# Patient Record
Sex: Female | Born: 1980 | Race: White | Hispanic: No | Smoking: Former smoker
Health system: Southern US, Community
[De-identification: ages and names within clinical notes are randomized; demographics above are authoritative.]

## PROBLEM LIST (undated history)

## (undated) HISTORY — PX: OTHER SURGICAL HISTORY: SHX169

---

## 2019-10-15 HISTORY — PX: LAPAROSCOPY: SHX197

## 2019-11-15 DIAGNOSIS — O00109 Unspecified tubal pregnancy without intrauterine pregnancy: Secondary | ICD-10-CM

## 2019-11-15 HISTORY — DX: Unspecified tubal pregnancy without intrauterine pregnancy: O00.109

## 2020-06-22 ENCOUNTER — Encounter (HOSPITAL_COMMUNITY): Admission: EM | Disposition: A | Discharge status: Rehabilitation Facility | Payer: Self-pay | Source: Home / Self Care

## 2020-06-22 ENCOUNTER — Emergency Department (HOSPITAL_COMMUNITY): Payer: Medicaid Other

## 2020-06-22 ENCOUNTER — Inpatient Hospital Stay (HOSPITAL_COMMUNITY): Payer: Medicaid Other | Admitting: Certified Registered"

## 2020-06-22 ENCOUNTER — Inpatient Hospital Stay (HOSPITAL_COMMUNITY): Payer: Medicaid Other

## 2020-06-22 ENCOUNTER — Inpatient Hospital Stay (HOSPITAL_COMMUNITY)
Admission: EM | Admit: 2020-06-22 | Discharge: 2020-07-07 | DRG: 481 | Disposition: A | Discharge status: Rehabilitation Facility | Payer: Self-pay | Attending: Physician Assistant | Admitting: Physician Assistant

## 2020-06-22 ENCOUNTER — Encounter (HOSPITAL_COMMUNITY): Payer: Self-pay | Admitting: General Surgery

## 2020-06-22 ENCOUNTER — Other Ambulatory Visit: Payer: Self-pay

## 2020-06-22 DIAGNOSIS — E559 Vitamin D deficiency, unspecified: Secondary | ICD-10-CM | POA: Diagnosis present

## 2020-06-22 DIAGNOSIS — S01112A Laceration without foreign body of left eyelid and periocular area, initial encounter: Secondary | ICD-10-CM | POA: Diagnosis present

## 2020-06-22 DIAGNOSIS — S82002A Unspecified fracture of left patella, initial encounter for closed fracture: Secondary | ICD-10-CM

## 2020-06-22 DIAGNOSIS — S60222A Contusion of left hand, initial encounter: Secondary | ICD-10-CM | POA: Diagnosis present

## 2020-06-22 DIAGNOSIS — Z820 Family history of epilepsy and other diseases of the nervous system: Secondary | ICD-10-CM

## 2020-06-22 DIAGNOSIS — Z87891 Personal history of nicotine dependence: Secondary | ICD-10-CM

## 2020-06-22 DIAGNOSIS — J9690 Respiratory failure, unspecified, unspecified whether with hypoxia or hypercapnia: Secondary | ICD-10-CM

## 2020-06-22 DIAGNOSIS — Z23 Encounter for immunization: Secondary | ICD-10-CM

## 2020-06-22 DIAGNOSIS — K5900 Constipation, unspecified: Secondary | ICD-10-CM | POA: Diagnosis present

## 2020-06-22 DIAGNOSIS — S2249XA Multiple fractures of ribs, unspecified side, initial encounter for closed fracture: Secondary | ICD-10-CM

## 2020-06-22 DIAGNOSIS — E8889 Other specified metabolic disorders: Secondary | ICD-10-CM | POA: Diagnosis present

## 2020-06-22 DIAGNOSIS — S298XXA Other specified injuries of thorax, initial encounter: Secondary | ICD-10-CM

## 2020-06-22 DIAGNOSIS — S72321A Displaced transverse fracture of shaft of right femur, initial encounter for closed fracture: Principal | ICD-10-CM | POA: Diagnosis present

## 2020-06-22 DIAGNOSIS — Z20822 Contact with and (suspected) exposure to covid-19: Secondary | ICD-10-CM | POA: Diagnosis present

## 2020-06-22 DIAGNOSIS — G8918 Other acute postprocedural pain: Secondary | ICD-10-CM | POA: Diagnosis present

## 2020-06-22 DIAGNOSIS — S82001A Unspecified fracture of right patella, initial encounter for closed fracture: Secondary | ICD-10-CM

## 2020-06-22 DIAGNOSIS — S82042A Displaced comminuted fracture of left patella, initial encounter for closed fracture: Secondary | ICD-10-CM | POA: Diagnosis present

## 2020-06-22 DIAGNOSIS — S82852A Displaced trimalleolar fracture of left lower leg, initial encounter for closed fracture: Secondary | ICD-10-CM | POA: Diagnosis present

## 2020-06-22 DIAGNOSIS — S20219A Contusion of unspecified front wall of thorax, initial encounter: Secondary | ICD-10-CM

## 2020-06-22 DIAGNOSIS — Z79890 Hormone replacement therapy (postmenopausal): Secondary | ICD-10-CM

## 2020-06-22 DIAGNOSIS — R000 Tachycardia, unspecified: Secondary | ICD-10-CM | POA: Diagnosis present

## 2020-06-22 DIAGNOSIS — S82892A Other fracture of left lower leg, initial encounter for closed fracture: Secondary | ICD-10-CM

## 2020-06-22 DIAGNOSIS — S2242XA Multiple fractures of ribs, left side, initial encounter for closed fracture: Secondary | ICD-10-CM | POA: Diagnosis present

## 2020-06-22 DIAGNOSIS — M79642 Pain in left hand: Secondary | ICD-10-CM | POA: Diagnosis present

## 2020-06-22 DIAGNOSIS — Z419 Encounter for procedure for purposes other than remedying health state, unspecified: Secondary | ICD-10-CM

## 2020-06-22 DIAGNOSIS — S0181XA Laceration without foreign body of other part of head, initial encounter: Secondary | ICD-10-CM | POA: Diagnosis present

## 2020-06-22 DIAGNOSIS — F419 Anxiety disorder, unspecified: Secondary | ICD-10-CM | POA: Diagnosis present

## 2020-06-22 DIAGNOSIS — D62 Acute posthemorrhagic anemia: Secondary | ICD-10-CM | POA: Diagnosis present

## 2020-06-22 DIAGNOSIS — Z833 Family history of diabetes mellitus: Secondary | ICD-10-CM

## 2020-06-22 DIAGNOSIS — Y92410 Unspecified street and highway as the place of occurrence of the external cause: Secondary | ICD-10-CM

## 2020-06-22 DIAGNOSIS — T148XXA Other injury of unspecified body region, initial encounter: Secondary | ICD-10-CM

## 2020-06-22 DIAGNOSIS — S7291XA Unspecified fracture of right femur, initial encounter for closed fracture: Secondary | ICD-10-CM | POA: Diagnosis present

## 2020-06-22 DIAGNOSIS — S82872A Displaced pilon fracture of left tibia, initial encounter for closed fracture: Secondary | ICD-10-CM | POA: Insufficient documentation

## 2020-06-22 HISTORY — PX: EXTERNAL FIXATION LEG: SHX1549

## 2020-06-22 HISTORY — PX: FEMUR IM NAIL: SHX1597

## 2020-06-22 HISTORY — PX: ORIF PATELLA: SHX5033

## 2020-06-22 LAB — URINALYSIS, ROUTINE W REFLEX MICROSCOPIC
Bacteria, UA: NONE SEEN
Bilirubin Urine: NEGATIVE
Glucose, UA: NEGATIVE mg/dL
Ketones, ur: 80 mg/dL — AB
Leukocytes,Ua: NEGATIVE
Nitrite: NEGATIVE
Protein, ur: NEGATIVE mg/dL
Specific Gravity, Urine: 1.045 — ABNORMAL HIGH (ref 1.005–1.030)
pH: 5 (ref 5.0–8.0)

## 2020-06-22 LAB — COMPREHENSIVE METABOLIC PANEL
ALT: 17 U/L (ref 0–44)
AST: 21 U/L (ref 15–41)
Albumin: 3.2 g/dL — ABNORMAL LOW (ref 3.5–5.0)
Alkaline Phosphatase: 27 U/L — ABNORMAL LOW (ref 38–126)
Anion gap: 8 (ref 5–15)
BUN: 17 mg/dL (ref 6–20)
CO2: 19 mmol/L — ABNORMAL LOW (ref 22–32)
Calcium: 7.6 mg/dL — ABNORMAL LOW (ref 8.9–10.3)
Chloride: 112 mmol/L — ABNORMAL HIGH (ref 98–111)
Creatinine, Ser: 0.67 mg/dL (ref 0.44–1.00)
GFR calc Af Amer: >60 mL/min (ref >60)
GFR calc non Af Amer: >60 mL/min (ref >60)
Glucose, Bld: 110 mg/dL — ABNORMAL HIGH (ref 70–99)
Potassium: 3.5 mmol/L (ref 3.5–5.1)
Sodium: 139 mmol/L (ref 135–145)
Total Bilirubin: 0.6 mg/dL (ref 0.3–1.2)
Total Protein: 5.7 g/dL — ABNORMAL LOW (ref 6.5–8.1)

## 2020-06-22 LAB — I-STAT CHEM 8, ED
BUN: 19 mg/dL (ref 6–20)
Calcium, Ion: 1.01 mmol/L — ABNORMAL LOW (ref 1.15–1.40)
Chloride: 110 mmol/L (ref 98–111)
Creatinine, Ser: 0.6 mg/dL (ref 0.44–1.00)
Glucose, Bld: 106 mg/dL — ABNORMAL HIGH (ref 70–99)
HCT: 36 % (ref 36.0–46.0)
Hemoglobin: 12.2 g/dL (ref 12.0–15.0)
Potassium: 3.4 mmol/L — ABNORMAL LOW (ref 3.5–5.1)
Sodium: 140 mmol/L (ref 135–145)
TCO2: 17 mmol/L — ABNORMAL LOW (ref 22–32)

## 2020-06-22 LAB — CBC
HCT: 39.2 % (ref 36.0–46.0)
Hemoglobin: 11.9 g/dL — ABNORMAL LOW (ref 12.0–15.0)
MCH: 28.5 pg (ref 26.0–34.0)
MCHC: 30.4 g/dL (ref 30.0–36.0)
MCV: 94 fL (ref 80.0–100.0)
Platelets: 220 10*3/uL (ref 150–400)
RBC: 4.17 MIL/uL (ref 3.87–5.11)
RDW: 12.9 % (ref 11.5–15.5)
WBC: 15 10*3/uL — ABNORMAL HIGH (ref 4.0–10.5)
nRBC: 0 % (ref 0.0–0.2)

## 2020-06-22 LAB — SAMPLE TO BLOOD BANK

## 2020-06-22 LAB — I-STAT BETA HCG BLOOD, ED (MC, WL, AP ONLY): I-stat hCG, quantitative: <5 m[IU]/mL (ref <5)

## 2020-06-22 LAB — SARS CORONAVIRUS 2 BY RT PCR (HOSPITAL ORDER, PERFORMED IN ~~LOC~~ HOSPITAL LAB): SARS Coronavirus 2: NEGATIVE

## 2020-06-22 LAB — ETHANOL: Alcohol, Ethyl (B): <10 mg/dL (ref <10)

## 2020-06-22 LAB — LACTIC ACID, PLASMA: Lactic Acid, Venous: 1.3 mmol/L (ref 0.5–1.9)

## 2020-06-22 LAB — SURGICAL PCR SCREEN
MRSA, PCR: NEGATIVE
Staphylococcus aureus: NEGATIVE

## 2020-06-22 LAB — PROTIME-INR
INR: 1 (ref 0.8–1.2)
Prothrombin Time: 12.7 seconds (ref 11.4–15.2)

## 2020-06-22 SURGERY — INSERTION, INTRAMEDULLARY ROD, FEMUR, RETROGRADE
Anesthesia: General | Site: Leg Upper | Laterality: Right

## 2020-06-22 MED ORDER — METHOCARBAMOL 500 MG PO TABS
ORAL_TABLET | ORAL | Status: AC
  Filled 2020-06-22: qty 1

## 2020-06-22 MED ORDER — VANCOMYCIN HCL 1000 MG IV SOLR
INTRAVENOUS | Status: DC | PRN
  Administered 2020-06-22: 1000 mg via TOPICAL

## 2020-06-22 MED ORDER — HYDROMORPHONE HCL 1 MG/ML IJ SOLN: 0.5000 mg | INTRAMUSCULAR | Status: DC | PRN

## 2020-06-22 MED ORDER — CEFAZOLIN SODIUM-DEXTROSE 2-4 GM/100ML-% IV SOLN
2.0000 g | Freq: Three times a day (TID) | INTRAVENOUS | Status: AC
  Administered 2020-06-22 – 2020-06-23 (×3): 2 g via INTRAVENOUS
  Filled 2020-06-22 (×3): qty 100

## 2020-06-22 MED ORDER — DOCUSATE SODIUM 100 MG PO CAPS
100.0000 mg | ORAL_CAPSULE | Freq: Two times a day (BID) | ORAL | Status: DC
  Administered 2020-06-22 – 2020-07-07 (×29): 100 mg via ORAL
  Filled 2020-06-22 (×29): qty 1

## 2020-06-22 MED ORDER — ACETAMINOPHEN 10 MG/ML IV SOLN
1000.0000 mg | Freq: Once | INTRAVENOUS | Status: AC
  Administered 2020-06-22: 1000 mg via INTRAVENOUS

## 2020-06-22 MED ORDER — ACETAMINOPHEN 500 MG PO TABS
1000.0000 mg | ORAL_TABLET | Freq: Four times a day (QID) | ORAL | Status: DC
  Administered 2020-06-22 – 2020-07-07 (×54): 1000 mg via ORAL
  Filled 2020-06-22 (×61): qty 2

## 2020-06-22 MED ORDER — FENTANYL CITRATE (PF) 100 MCG/2ML IJ SOLN
50.0000 ug | INTRAMUSCULAR | Status: DC | PRN
  Administered 2020-06-22: 50 ug via INTRAVENOUS
  Filled 2020-06-22: qty 2

## 2020-06-22 MED ORDER — KETAMINE HCL 50 MG/5ML IJ SOSY
1.0000 mg/kg | PREFILLED_SYRINGE | Freq: Once | INTRAMUSCULAR | Status: DC
  Filled 2020-06-22: qty 10

## 2020-06-22 MED ORDER — ROCURONIUM BROMIDE 10 MG/ML (PF) SYRINGE
PREFILLED_SYRINGE | INTRAVENOUS | Status: AC
  Filled 2020-06-22: qty 10

## 2020-06-22 MED ORDER — HYDROMORPHONE HCL 1 MG/ML IJ SOLN
0.2500 mg | INTRAMUSCULAR | Status: DC | PRN
  Administered 2020-06-22 (×7): 0.5 mg via INTRAVENOUS

## 2020-06-22 MED ORDER — PHENYLEPHRINE 40 MCG/ML (10ML) SYRINGE FOR IV PUSH (FOR BLOOD PRESSURE SUPPORT)
PREFILLED_SYRINGE | INTRAVENOUS | Status: DC | PRN
  Administered 2020-06-22: 160 ug via INTRAVENOUS
  Administered 2020-06-22 (×2): 120 ug via INTRAVENOUS

## 2020-06-22 MED ORDER — POVIDONE-IODINE 10 % EX SWAB: 2.0000 "application " | Freq: Once | CUTANEOUS | Status: DC

## 2020-06-22 MED ORDER — OXYCODONE HCL 5 MG PO TABS: 5.0000 mg | ORAL_TABLET | ORAL | Status: DC | PRN

## 2020-06-22 MED ORDER — ENOXAPARIN SODIUM 40 MG/0.4ML ~~LOC~~ SOLN: 40.0000 mg | SUBCUTANEOUS | Status: DC

## 2020-06-22 MED ORDER — IOHEXOL 300 MG/ML  SOLN
100.0000 mL | Freq: Once | INTRAMUSCULAR | Status: AC | PRN
  Administered 2020-06-22: 100 mL via INTRAVENOUS

## 2020-06-22 MED ORDER — METHOCARBAMOL 500 MG PO TABS
500.0000 mg | ORAL_TABLET | Freq: Four times a day (QID) | ORAL | Status: DC | PRN
  Administered 2020-06-22 – 2020-06-23 (×2): 500 mg via ORAL
  Filled 2020-06-22 (×2): qty 1

## 2020-06-22 MED ORDER — DEXAMETHASONE SODIUM PHOSPHATE 10 MG/ML IJ SOLN
INTRAMUSCULAR | Status: DC | PRN
  Administered 2020-06-22: 5 mg via INTRAVENOUS

## 2020-06-22 MED ORDER — FENTANYL CITRATE (PF) 250 MCG/5ML IJ SOLN
INTRAMUSCULAR | Status: AC
  Filled 2020-06-22: qty 5

## 2020-06-22 MED ORDER — CHLORHEXIDINE GLUCONATE 4 % EX LIQD: 60.0000 mL | Freq: Once | CUTANEOUS | Status: DC

## 2020-06-22 MED ORDER — TETANUS-DIPHTH-ACELL PERTUSSIS 5-2.5-18.5 LF-MCG/0.5 IM SUSP
0.5000 mL | Freq: Once | INTRAMUSCULAR | Status: AC
  Administered 2020-06-22: 0.5 mL via INTRAMUSCULAR

## 2020-06-22 MED ORDER — FENTANYL CITRATE (PF) 100 MCG/2ML IJ SOLN
INTRAMUSCULAR | Status: AC
  Filled 2020-06-22: qty 2

## 2020-06-22 MED ORDER — HYDROMORPHONE HCL 1 MG/ML IJ SOLN
INTRAMUSCULAR | Status: AC
  Filled 2020-06-22: qty 1

## 2020-06-22 MED ORDER — DEXAMETHASONE SODIUM PHOSPHATE 10 MG/ML IJ SOLN
INTRAMUSCULAR | Status: AC
  Filled 2020-06-22: qty 1

## 2020-06-22 MED ORDER — CHLORHEXIDINE GLUCONATE 0.12 % MT SOLN
15.0000 mL | Freq: Once | OROMUCOSAL | Status: AC
  Filled 2020-06-22: qty 15

## 2020-06-22 MED ORDER — MIDAZOLAM HCL 2 MG/2ML IJ SOLN
INTRAMUSCULAR | Status: DC | PRN
  Administered 2020-06-22: 2 mg via INTRAVENOUS

## 2020-06-22 MED ORDER — DIPHENHYDRAMINE HCL 25 MG PO CAPS: 25.0000 mg | ORAL_CAPSULE | Freq: Four times a day (QID) | ORAL | Status: DC | PRN

## 2020-06-22 MED ORDER — ACETAMINOPHEN 10 MG/ML IV SOLN
INTRAVENOUS | Status: AC
  Filled 2020-06-22: qty 100

## 2020-06-22 MED ORDER — MIDAZOLAM HCL 2 MG/2ML IJ SOLN
INTRAMUSCULAR | Status: AC
  Filled 2020-06-22: qty 2

## 2020-06-22 MED ORDER — VANCOMYCIN HCL 1000 MG IV SOLR
INTRAVENOUS | Status: AC
  Filled 2020-06-22: qty 1000

## 2020-06-22 MED ORDER — LIDOCAINE-EPINEPHRINE 1 %-1:100000 IJ SOLN
20.0000 mL | Freq: Once | INTRAMUSCULAR | Status: AC
  Administered 2020-06-22: 20 mL
  Filled 2020-06-22: qty 1

## 2020-06-22 MED ORDER — KETAMINE HCL 10 MG/ML IJ SOLN
INTRAMUSCULAR | Status: AC | PRN
  Administered 2020-06-22: 74.8 mg via INTRAVENOUS

## 2020-06-22 MED ORDER — OXYCODONE HCL 5 MG PO TABS
10.0000 mg | ORAL_TABLET | ORAL | Status: DC | PRN
  Administered 2020-06-23: 15 mg via ORAL
  Filled 2020-06-22 (×2): qty 3

## 2020-06-22 MED ORDER — FENTANYL CITRATE (PF) 100 MCG/2ML IJ SOLN
INTRAMUSCULAR | Status: DC | PRN
Start: 2020-06-22 — End: 2020-06-22
  Administered 2020-06-22 (×2): 50 ug via INTRAVENOUS
  Administered 2020-06-22: 100 ug via INTRAVENOUS
  Administered 2020-06-22: 50 ug via INTRAVENOUS
  Administered 2020-06-22: 100 ug via INTRAVENOUS
  Administered 2020-06-22: 50 ug via INTRAVENOUS
  Administered 2020-06-22: 100 ug via INTRAVENOUS

## 2020-06-22 MED ORDER — MORPHINE SULFATE (PF) 2 MG/ML IV SOLN
2.0000 mg | INTRAVENOUS | Status: DC | PRN
  Administered 2020-06-22 – 2020-06-23 (×5): 2 mg via INTRAVENOUS
  Filled 2020-06-22 (×5): qty 1

## 2020-06-22 MED ORDER — CEFAZOLIN SODIUM-DEXTROSE 2-4 GM/100ML-% IV SOLN
2.0000 g | INTRAVENOUS | Status: AC
  Administered 2020-06-22: 2 g via INTRAVENOUS
  Filled 2020-06-22: qty 100

## 2020-06-22 MED ORDER — ONDANSETRON HCL 4 MG/2ML IJ SOLN: 4.0000 mg | Freq: Four times a day (QID) | INTRAMUSCULAR | Status: DC | PRN

## 2020-06-22 MED ORDER — OXYCODONE HCL 5 MG PO TABS
5.0000 mg | ORAL_TABLET | ORAL | Status: DC | PRN
  Administered 2020-06-23: 10 mg via ORAL
  Filled 2020-06-22: qty 2

## 2020-06-22 MED ORDER — GABAPENTIN 100 MG PO CAPS
100.0000 mg | ORAL_CAPSULE | Freq: Three times a day (TID) | ORAL | Status: DC
  Administered 2020-06-22 – 2020-06-23 (×3): 100 mg via ORAL
  Filled 2020-06-22 (×3): qty 1

## 2020-06-22 MED ORDER — KETAMINE HCL 10 MG/ML IJ SOLN
INTRAMUSCULAR | Status: AC | PRN
  Administered 2020-06-22: 25 mg via INTRAVENOUS

## 2020-06-22 MED ORDER — ONDANSETRON HCL 4 MG/2ML IJ SOLN
INTRAMUSCULAR | Status: AC
  Filled 2020-06-22: qty 2

## 2020-06-22 MED ORDER — PHENYLEPHRINE 40 MCG/ML (10ML) SYRINGE FOR IV PUSH (FOR BLOOD PRESSURE SUPPORT)
PREFILLED_SYRINGE | INTRAVENOUS | Status: AC
  Filled 2020-06-22: qty 10

## 2020-06-22 MED ORDER — LIDOCAINE 2% (20 MG/ML) 5 ML SYRINGE
INTRAMUSCULAR | Status: AC
  Filled 2020-06-22: qty 5

## 2020-06-22 MED ORDER — 0.9 % SODIUM CHLORIDE (POUR BTL) OPTIME
TOPICAL | Status: DC | PRN
  Administered 2020-06-22: 1000 mL

## 2020-06-22 MED ORDER — LACTATED RINGERS IV SOLN: INTRAVENOUS | Status: DC

## 2020-06-22 MED ORDER — METOPROLOL TARTRATE 5 MG/5ML IV SOLN: 5.0000 mg | Freq: Four times a day (QID) | INTRAVENOUS | Status: DC | PRN

## 2020-06-22 MED ORDER — SUGAMMADEX SODIUM 200 MG/2ML IV SOLN
INTRAVENOUS | Status: DC | PRN
  Administered 2020-06-22: 140 mg via INTRAVENOUS

## 2020-06-22 MED ORDER — PROPOFOL 10 MG/ML IV BOLUS
INTRAVENOUS | Status: DC | PRN
  Administered 2020-06-22: 30 mg via INTRAVENOUS
  Administered 2020-06-22: 150 mg via INTRAVENOUS

## 2020-06-22 MED ORDER — ONDANSETRON HCL 4 MG/2ML IJ SOLN
INTRAMUSCULAR | Status: DC | PRN
  Administered 2020-06-22: 4 mg via INTRAVENOUS

## 2020-06-22 MED ORDER — SUCCINYLCHOLINE CHLORIDE 20 MG/ML IJ SOLN
INTRAMUSCULAR | Status: DC | PRN
  Administered 2020-06-22: 130 mg via INTRAVENOUS

## 2020-06-22 MED ORDER — SUCCINYLCHOLINE CHLORIDE 200 MG/10ML IV SOSY
PREFILLED_SYRINGE | INTRAVENOUS | Status: AC
  Filled 2020-06-22: qty 10

## 2020-06-22 MED ORDER — ROCURONIUM BROMIDE 10 MG/ML (PF) SYRINGE
PREFILLED_SYRINGE | INTRAVENOUS | Status: DC | PRN
  Administered 2020-06-22: 50 mg via INTRAVENOUS

## 2020-06-22 MED ORDER — SODIUM CHLORIDE 0.9 % IV BOLUS
1000.0000 mL | Freq: Once | INTRAVENOUS | Status: AC
  Administered 2020-06-22: 1000 mL via INTRAVENOUS

## 2020-06-22 MED ORDER — ONDANSETRON 4 MG PO TBDP: 4.0000 mg | ORAL_TABLET | Freq: Four times a day (QID) | ORAL | Status: DC | PRN

## 2020-06-22 MED ORDER — PROPOFOL 10 MG/ML IV BOLUS
INTRAVENOUS | Status: AC
  Filled 2020-06-22: qty 20

## 2020-06-22 MED ORDER — FENTANYL CITRATE (PF) 100 MCG/2ML IJ SOLN
INTRAMUSCULAR | Status: AC | PRN
  Administered 2020-06-22: 100 ug via INTRAVENOUS

## 2020-06-22 MED ORDER — SODIUM CHLORIDE 0.9 % IV SOLN: INTRAVENOUS | Status: DC

## 2020-06-22 MED ORDER — ALBUMIN HUMAN 5 % IV SOLN: INTRAVENOUS | Status: DC | PRN

## 2020-06-22 MED ORDER — LIDOCAINE 2% (20 MG/ML) 5 ML SYRINGE
INTRAMUSCULAR | Status: DC | PRN
  Administered 2020-06-22: 60 mg via INTRAVENOUS

## 2020-06-22 MED ORDER — CHLORHEXIDINE GLUCONATE 0.12 % MT SOLN
OROMUCOSAL | Status: AC
  Administered 2020-06-22: 15 mL via OROMUCOSAL
  Filled 2020-06-22: qty 15

## 2020-06-22 SURGICAL SUPPLY — 101 items
ADH SKN CLS APL DERMABOND .7 (GAUZE/BANDAGES/DRESSINGS) ×3
APL PRP STRL LF DISP 70% ISPRP (MISCELLANEOUS) ×9
BAR EXFX 150X11 NS LF (EXFIX) ×6
BAR EXFX 350X11 NS LF (EXFIX) ×6
BAR GLASS FIBER EXFX 11X150 (EXFIX) ×10 IMPLANT
BAR GLASS FIBER EXFX 11X350 (EXFIX) ×10 IMPLANT
BIT DRILL 2.4 AO COUPLING CANN (BIT) ×5 IMPLANT
BIT DRILL 3.2 XTRAFIX BLUE (BIT) ×5 IMPLANT
BIT DRILL CALIBRATED 4.3MMX365 (DRILL) ×3 IMPLANT
BIT DRILL CANN MED FLUTE 4.0 (BIT) ×3 IMPLANT
BIT DRILL CROWE PNT TWST 4.5MM (DRILL) ×6 IMPLANT
BNDG CMPR MED 10X6 ELC LF (GAUZE/BANDAGES/DRESSINGS) ×3
BNDG COHESIVE 4X5 TAN STRL (GAUZE/BANDAGES/DRESSINGS) IMPLANT
BNDG ELASTIC 4X5.8 VLCR STR LF (GAUZE/BANDAGES/DRESSINGS) ×5 IMPLANT
BNDG ELASTIC 6X10 VLCR STRL LF (GAUZE/BANDAGES/DRESSINGS) ×5 IMPLANT
BNDG ELASTIC 6X5.8 VLCR STR LF (GAUZE/BANDAGES/DRESSINGS) IMPLANT
BNDG GAUZE ELAST 4 BULKY (GAUZE/BANDAGES/DRESSINGS) ×5 IMPLANT
BRUSH SCRUB EZ PLAIN DRY (MISCELLANEOUS) ×10 IMPLANT
CHLORAPREP W/TINT 26 (MISCELLANEOUS) ×15 IMPLANT
CLAMP BLUE BAR TO BAR (EXFIX) ×10 IMPLANT
CLAMP BLUE BAR TO PIN (EXFIX) ×20 IMPLANT
CLOSURE WOUND 1/2 X4 (GAUZE/BANDAGES/DRESSINGS)
COVER MAYO STAND STRL (DRAPES) IMPLANT
COVER SURGICAL LIGHT HANDLE (MISCELLANEOUS) ×10 IMPLANT
COVER WAND RF STERILE (DRAPES) IMPLANT
DERMABOND ADVANCED (GAUZE/BANDAGES/DRESSINGS) ×2
DERMABOND ADVANCED .7 DNX12 (GAUZE/BANDAGES/DRESSINGS) ×3 IMPLANT
DRAPE C-ARM 42X72 X-RAY (DRAPES) ×5 IMPLANT
DRAPE C-ARMOR (DRAPES) ×5 IMPLANT
DRAPE HALF SHEET 40X57 (DRAPES) ×10 IMPLANT
DRAPE IMP U-DRAPE 54X76 (DRAPES) ×10 IMPLANT
DRAPE INCISE IOBAN 66X45 STRL (DRAPES) ×5 IMPLANT
DRAPE ORTHO SPLIT 77X108 STRL (DRAPES) ×20
DRAPE SURG 17X23 STRL (DRAPES) ×5 IMPLANT
DRAPE SURG ORHT 6 SPLT 77X108 (DRAPES) ×12 IMPLANT
DRAPE U-SHAPE 47X51 STRL (DRAPES) ×20 IMPLANT
DRILL CALIBRATED 4.3MMX365 (DRILL) ×5
DRILL CANN 4.0MM (BIT) ×5
DRILL CROWE POINT TWIST 4.5MM (DRILL) ×10
DRSG MEPILEX BORDER 4X4 (GAUZE/BANDAGES/DRESSINGS) ×5 IMPLANT
DRSG MEPILEX BORDER 4X8 (GAUZE/BANDAGES/DRESSINGS) ×5 IMPLANT
DRSG MEPITEL 4X7.2 (GAUZE/BANDAGES/DRESSINGS) IMPLANT
ELECT REM PT RETURN 9FT ADLT (ELECTROSURGICAL) ×5
ELECTRODE REM PT RTRN 9FT ADLT (ELECTROSURGICAL) ×3 IMPLANT
GAUZE SPONGE 4X4 12PLY STRL (GAUZE/BANDAGES/DRESSINGS) IMPLANT
GLOVE BIO SURGEON STRL SZ 6.5 (GLOVE) ×12 IMPLANT
GLOVE BIO SURGEON STRL SZ7.5 (GLOVE) ×20 IMPLANT
GLOVE BIO SURGEONS STRL SZ 6.5 (GLOVE) ×3
GLOVE BIOGEL PI IND STRL 6.5 (GLOVE) ×3 IMPLANT
GLOVE BIOGEL PI IND STRL 7.5 (GLOVE) ×3 IMPLANT
GLOVE BIOGEL PI INDICATOR 6.5 (GLOVE) ×2
GLOVE BIOGEL PI INDICATOR 7.5 (GLOVE) ×2
GOWN STRL REUS W/ TWL LRG LVL3 (GOWN DISPOSABLE) ×12 IMPLANT
GOWN STRL REUS W/TWL LRG LVL3 (GOWN DISPOSABLE) ×20
GUIDEPIN 3.2X17.5 THRD DISP (PIN) ×5 IMPLANT
GUIDEWIRE BEAD TIP (WIRE) ×5 IMPLANT
K-WIRE TROC 1.25X150 (WIRE) ×5
KIT BASIN OR (CUSTOM PROCEDURE TRAY) ×5 IMPLANT
KIT TURNOVER KIT B (KITS) ×5 IMPLANT
KWIRE TROC 1.25X150 (WIRE) ×3 IMPLANT
MANIFOLD NEPTUNE II (INSTRUMENTS) IMPLANT
NAIL FEM RETRO 9X340 (Nail) ×5 IMPLANT
NEEDLE 22X1 1/2 (OR ONLY) (NEEDLE) IMPLANT
NS IRRIG 1000ML POUR BTL (IV SOLUTION) ×5 IMPLANT
PACK ORTHO EXTREMITY (CUSTOM PROCEDURE TRAY) ×5 IMPLANT
PAD ARMBOARD 7.5X6 YLW CONV (MISCELLANEOUS) ×10 IMPLANT
PAD CAST 4YDX4 CTTN HI CHSV (CAST SUPPLIES) ×6 IMPLANT
PADDING CAST COTTON 4X4 STRL (CAST SUPPLIES) ×10
PADDING CAST COTTON 6X4 STRL (CAST SUPPLIES) IMPLANT
PIN 4X100X20MM (EXFIX) ×10 IMPLANT
PIN CLAMP 2BAR 75MM BLUE (EXFIX) ×5 IMPLANT
PIN HALF YELLOW 5X160X35 (EXFIX) ×10 IMPLANT
RETRIEVER SUT HEWSON (MISCELLANEOUS) ×5 IMPLANT
SCREW CANN 4.0X38MM (Screw) ×5 IMPLANT
SCREW CANN PT 4.0X28 (Screw) ×5 IMPLANT
SCREW CANNULATED NS 4MMX36MM (Screw) ×10 IMPLANT
SCREW CANNULATED PT 4.0X42 (Screw) ×5 IMPLANT
SCREW CORT TI DBL LEAD 5X32 (Screw) ×5 IMPLANT
SCREW CORT TI DBL LEAD 5X34 (Screw) ×5 IMPLANT
SCREW CORT TI DBL LEAD 5X48 (Screw) ×5 IMPLANT
SCREW CORT TI DBL LEAD 5X65 (Screw) ×5 IMPLANT
SCREW CORT TI DBL LEAD 5X70 (Screw) ×5 IMPLANT
SPONGE LAP 18X18 RF (DISPOSABLE) ×5 IMPLANT
STAPLER VISISTAT 35W (STAPLE) IMPLANT
STRIP CLOSURE SKIN 1/2X4 (GAUZE/BANDAGES/DRESSINGS) IMPLANT
SUT FIBERWIRE #5 38 CONV NDL (SUTURE) ×10
SUT MNCRL AB 3-0 PS2 18 (SUTURE) ×15 IMPLANT
SUT PROLENE 0 CT (SUTURE) IMPLANT
SUT VIC AB 0 CT1 27 (SUTURE) ×5
SUT VIC AB 0 CT1 27XBRD ANBCTR (SUTURE) ×3 IMPLANT
SUT VIC AB 1 CT1 27 (SUTURE) ×5
SUT VIC AB 1 CT1 27XBRD ANBCTR (SUTURE) ×3 IMPLANT
SUT VIC AB 2-0 CT1 27 (SUTURE) ×10
SUT VIC AB 2-0 CT1 TAPERPNT 27 (SUTURE) ×6 IMPLANT
SUTURE FIBERWR #5 38 CONV NDL (SUTURE) ×6 IMPLANT
TOWEL GREEN STERILE (TOWEL DISPOSABLE) ×10 IMPLANT
TOWEL GREEN STERILE FF (TOWEL DISPOSABLE) ×10 IMPLANT
TUBE CONNECTING 12'X1/4 (SUCTIONS) ×1
TUBE CONNECTING 12X1/4 (SUCTIONS) ×4 IMPLANT
UNDERPAD 30X36 HEAVY ABSORB (UNDERPADS AND DIAPERS) ×10 IMPLANT
YANKAUER SUCT BULB TIP NO VENT (SUCTIONS) ×5 IMPLANT

## 2020-06-22 NOTE — Transfer of Care (Signed)
Immediate Anesthesia Transfer of Care Note  Patient: Megan Bailey  Procedure(s) Performed: RIGHT INTRAMEDULLARY (IM) RETROGRADE FEMORAL NAILING (Right Leg Upper) EXTERNAL FIXATION LEFT ANKLE (Left Leg Lower) OPEN REDUCTION INTERNAL (ORIF) FIXATION BILATERAL PATELLAS (Bilateral Knee)  Patient Location: PACU  Anesthesia Type:General  Level of Consciousness: awake, alert  and oriented  Airway & Oxygen Therapy: Patient Spontanous Breathing  Post-op Assessment: Report given to RN  Post vital signs: Reviewed and stable  Last Vitals:  Vitals Value Taken Time  BP    Temp    Pulse 126 06/22/20 1944  Resp 21 06/22/20 1944  SpO2 100 % 06/22/20 1944  Vitals shown include unvalidated device data.  Last Pain:  Vitals:   06/22/20 1340  TempSrc: Oral  PainSc:          Complications: No complications documented.

## 2020-06-22 NOTE — Anesthesia Postprocedure Evaluation (Signed)
Anesthesia Post Note  Patient: Megan Bailey  Procedure(s) Performed: RIGHT INTRAMEDULLARY (IM) RETROGRADE FEMORAL NAILING (Right Leg Upper) EXTERNAL FIXATION LEFT ANKLE (Left Leg Lower) OPEN REDUCTION INTERNAL (ORIF) FIXATION BILATERAL PATELLAS (Bilateral Knee)     Patient location during evaluation: PACU Anesthesia Type: General Level of consciousness: awake and alert Pain control: pain improving. Vital Signs Assessment: post-procedure vital signs reviewed and stable Respiratory status: spontaneous breathing, nonlabored ventilation, respiratory function stable and patient connected to nasal cannula oxygen Cardiovascular status: blood pressure returned to baseline, stable and tachycardic Postop Assessment: no apparent nausea or vomiting Anesthetic complications: no   No complications documented.  Last Vitals:  Vitals:   06/22/20 2015 06/22/20 2030  BP: 118/67 115/78  Pulse: (!) 116 (!) 115  Resp: (!) 31 (!) 23  Temp:    SpO2: 100% 99%    Last Pain:  Vitals:   06/22/20 2030  TempSrc:   PainSc: 7                  Cecile Hearing
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
## 2020-06-22 NOTE — ED Provider Notes (Signed)
..  Laceration Repair  Date/Time: 06/22/2020 11:26 AM Performed by: Renne Crigler, PA-C Authorized by: Renne Crigler, PA-C   Consent:    Consent obtained:  Emergent situation Anesthesia (see MAR for exact dosages):    Anesthesia method:  Local infiltration   Local anesthetic:  Lidocaine 2% WITH epi Laceration details:    Location:  Face   Face location:  L eyebrow   Length (cm):  4 Repair type:    Repair type:  Simple Pre-procedure details:    Preparation:  Patient was prepped and draped in usual sterile fashion and imaging obtained to evaluate for foreign bodies Exploration:    Hemostasis achieved with:  LET and epinephrine   Wound exploration: wound explored through full range of motion and entire depth of wound probed and visualized     Wound extent: no muscle damage noted     Contaminated: no   Treatment:    Area cleansed with:  Saline   Amount of cleaning:  Standard   Visualized foreign bodies/material removed: no   Skin repair:    Repair method:  Sutures   Suture size:  5-0   Suture material:  Nylon   Suture technique:  Simple interrupted   Number of sutures:  7 Approximation:    Approximation:  Close Post-procedure details:    Dressing:  Open (no dressing)   Patient tolerance of procedure:  Tolerated well, no immediate complications      Renne Crigler, PA-C 06/22/20 1127    Terrilee Files, MD 06/22/20 [id number]
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
## 2020-06-22 NOTE — Progress Notes (Signed)
Pt arrived from ED via hospital bed  Accompanied by staff/BF Alert//oriented in no apparent distress.orientated/sitiated to room/equipments. Welcome guide/menu provided with instructions. Pt verbalized understanding of instructions. Pt has been informed that facility is not responsible for any losses/damages to any personal belongings/valuables and its best to be kept at home and hand it over to security for accounting puroposes. No compalints voiced.

## 2020-06-22 NOTE — Anesthesia Procedure Notes (Signed)
Procedure Name: Intubation Date/Time: 06/22/2020 4:54 PM Performed by: Barrington Ellison, CRNA Pre-anesthesia Checklist: Patient identified, Emergency Drugs available, Suction available and Patient being monitored Patient Re-evaluated:Patient Re-evaluated prior to induction Oxygen Delivery Method: Circle System Utilized Preoxygenation: Pre-oxygenation with 100% oxygen Induction Type: IV induction Ventilation: Mask ventilation without difficulty Laryngoscope Size: Mac and 3 Grade View: Grade I Tube type: Oral Tube size: 7.0 mm Number of attempts: 1 Airway Equipment and Method: Stylet and Oral airway Placement Confirmation: ETT inserted through vocal cords under direct vision,  positive ETCO2 and breath sounds checked- equal and bilateral Secured at: 21 cm Tube secured with: Tape Dental Injury: Teeth and Oropharynx as per pre-operative assessment

## 2020-06-22 NOTE — Progress Notes (Signed)
Orthopedic Tech Progress Note Patient Details:  Megan Bailey [birth year] [id number]  Ortho Devices Type of Ortho Device: Knee Immobilizer Splint Material: Plaster Ortho Device/Splint Location: Left Knee Ortho Device/Splint Interventions: Application, Adjustment   Post Interventions Patient Tolerated: Well Instructions Provided: Adjustment of device   [name] 06/22/2020, 10:02 PM
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
## 2020-06-22 NOTE — Anesthesia Preprocedure Evaluation (Signed)
Anesthesia Evaluation  Patient identified by MRN, date of birth, ID band Patient awake  General Assessment Comment:History noted CG  Reviewed: Allergy & Precautions, NPO status , Patient's Chart, lab work & pertinent test results  Airway Mallampati: II  TM Distance: >3 FB     Dental   Pulmonary former smoker,    breath sounds clear to auscultation       Cardiovascular negative cardio ROS   Rhythm:Regular Rate:Normal     Neuro/Psych    GI/Hepatic negative GI ROS, Neg liver ROS,   Endo/Other  negative endocrine ROS  Renal/GU negative Renal ROS     Musculoskeletal   Abdominal   Peds  Hematology   Anesthesia Other Findings   Reproductive/Obstetrics                             Anesthesia Physical Anesthesia Plan  ASA: I and emergent  Anesthesia Plan: General   Post-op Pain Management:    Induction: Intravenous  PONV Risk Score and Plan: 3 and Ondansetron, Dexamethasone and Midazolam  Airway Management Planned: Oral ETT  Additional Equipment:   Intra-op Plan:   Post-operative Plan: Possible Post-op intubation/ventilation  Informed Consent: I have reviewed the patients History and Physical, chart, labs and discussed the procedure including the risks, benefits and alternatives for the proposed anesthesia with the patient or authorized representative who has indicated his/her understanding and acceptance.       Plan Discussed with: CRNA and Anesthesiologist  Anesthesia Plan Comments:         Anesthesia Quick Evaluation

## 2020-06-22 NOTE — OR Nursing (Signed)
Care of patient assumed at 1911.

## 2020-06-22 NOTE — Progress Notes (Signed)
   06/22/20 0824  Clinical Encounter Type  Visited With Patient  Visit Type Trauma  Referral From Nurse  Consult/Referral To Chaplain  Spiritual Encounters  Spiritual Needs Emotional  Stress Factors  Patient Stress Factors Major life changes;None identified  Family Stress Factors None identified  Chaplain responded to Level 2 Trama
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
## 2020-06-22 NOTE — Progress Notes (Signed)
Orthopedic Tech Progress Note Patient Details:  Megan Bailey [birth year] [id number]  Ortho Devices Type of Ortho Device: Post (short) splint, Stirrup splint Splint Material: Plaster Ortho Device/Splint Interventions: Ordered, Application, Adjustment      Post Interventions Patient Tolerated: Well Instructions Provided: Adjustment of device   [name] 06/22/2020, 11:39 AM

## 2020-06-22 NOTE — Progress Notes (Signed)
Orthopedic Tech Progress Note Patient Details:  Megan Bailey Jun 28, 1981 [id number]  Musculoskeletal Traction Type of Traction: Bucks Skin Traction Traction Location: RLE Traction Weight: 15 lbs   Post Interventions Patient Tolerated: Well Instructions Provided: Adjustment of device   Daine Gunther 06/22/2020, 11:40 AM

## 2020-06-22 NOTE — Procedures (Signed)
Procedure: Closed reduction right femur and left ankle  Indication: Right femur and left ankle fractures  Surgeon: Charma Igo, PA-C  Assist: None  Anesthesia: Ketamine as CS via EDP  EBL: None  Complications: None  Findings: After risks/benefits explained patient desires to undergo procedure. Consent obtained and time out performed. Sedation was given and verified. Left ankle was reduced and splinted. Right femur was reduced and Bucks traction applied. Pt tolerated the procedure well.    Freeman Caldron, PA-C Orthopedic Surgery [phone]
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
## 2020-06-23 ENCOUNTER — Encounter (HOSPITAL_COMMUNITY): Payer: Self-pay | Admitting: Student

## 2020-06-23 ENCOUNTER — Inpatient Hospital Stay (HOSPITAL_COMMUNITY): Payer: Medicaid Other

## 2020-06-23 LAB — CBC
HCT: 28.2 % — ABNORMAL LOW (ref 36.0–46.0)
HCT: 27.1 % — ABNORMAL LOW (ref 36.0–46.0)
HCT: 24.2 % — ABNORMAL LOW (ref 36.0–46.0)
Hemoglobin: 9.3 g/dL — ABNORMAL LOW (ref 12.0–15.0)
Hemoglobin: 8.5 g/dL — ABNORMAL LOW (ref 12.0–15.0)
Hemoglobin: 7.5 g/dL — ABNORMAL LOW (ref 12.0–15.0)
MCH: 29.8 pg (ref 26.0–34.0)
MCH: 28.5 pg (ref 26.0–34.0)
MCH: 28.2 pg (ref 26.0–34.0)
MCHC: 33 g/dL (ref 30.0–36.0)
MCHC: 31.4 g/dL (ref 30.0–36.0)
MCHC: 31 g/dL (ref 30.0–36.0)
MCV: 90.4 fL (ref 80.0–100.0)
MCV: 90.9 fL (ref 80.0–100.0)
MCV: 91 fL (ref 80.0–100.0)
Platelets: 240 10*3/uL (ref 150–400)
Platelets: 226 10*3/uL (ref 150–400)
Platelets: 188 10*3/uL (ref 150–400)
RBC: 3.12 MIL/uL — ABNORMAL LOW (ref 3.87–5.11)
RBC: 2.98 MIL/uL — ABNORMAL LOW (ref 3.87–5.11)
RBC: 2.66 MIL/uL — ABNORMAL LOW (ref 3.87–5.11)
RDW: 13.2 % (ref 11.5–15.5)
RDW: 13.2 % (ref 11.5–15.5)
RDW: 13.2 % (ref 11.5–15.5)
WBC: 7 10*3/uL (ref 4.0–10.5)
WBC: 8.7 10*3/uL (ref 4.0–10.5)
WBC: 7.6 10*3/uL (ref 4.0–10.5)
nRBC: 0 % (ref 0.0–0.2)
nRBC: 0 % (ref 0.0–0.2)
nRBC: 0 % (ref 0.0–0.2)

## 2020-06-23 LAB — VITAMIN D 25 HYDROXY (VIT D DEFICIENCY, FRACTURES): Vit D, 25-Hydroxy: 18.56 ng/mL — ABNORMAL LOW (ref 30–100)

## 2020-06-23 LAB — HIV ANTIBODY (ROUTINE TESTING W REFLEX): HIV Screen 4th Generation wRfx: NONREACTIVE

## 2020-06-23 LAB — BASIC METABOLIC PANEL
Anion gap: 7 (ref 5–15)
BUN: <5 mg/dL — ABNORMAL LOW (ref 6–20)
CO2: 23 mmol/L (ref 22–32)
Calcium: 7.4 mg/dL — ABNORMAL LOW (ref 8.9–10.3)
Chloride: 107 mmol/L (ref 98–111)
Creatinine, Ser: 0.53 mg/dL (ref 0.44–1.00)
GFR calc Af Amer: >60 mL/min (ref >60)
GFR calc non Af Amer: >60 mL/min (ref >60)
Glucose, Bld: 160 mg/dL — ABNORMAL HIGH (ref 70–99)
Potassium: 3.2 mmol/L — ABNORMAL LOW (ref 3.5–5.1)
Sodium: 137 mmol/L (ref 135–145)

## 2020-06-23 MED ORDER — TRAMADOL HCL 50 MG PO TABS
50.0000 mg | ORAL_TABLET | Freq: Four times a day (QID) | ORAL | Status: DC | PRN
Start: 2020-06-23 — End: 2020-06-23

## 2020-06-23 MED ORDER — VITAMIN D 25 MCG (1000 UNIT) PO TABS
2000.0000 [IU] | ORAL_TABLET | Freq: Two times a day (BID) | ORAL | Status: DC
  Administered 2020-06-23 – 2020-07-07 (×27): 2000 [IU] via ORAL
  Filled 2020-06-23 (×29): qty 2

## 2020-06-23 MED ORDER — CHLORHEXIDINE GLUCONATE CLOTH 2 % EX PADS
6.0000 | MEDICATED_PAD | Freq: Every day | CUTANEOUS | Status: DC
  Administered 2020-06-23 – 2020-07-07 (×10): 6 via TOPICAL

## 2020-06-23 MED ORDER — POLYETHYLENE GLYCOL 3350 17 G PO PACK
17.0000 g | PACK | Freq: Every day | ORAL | Status: DC
  Administered 2020-06-23 – 2020-07-02 (×7): 17 g via ORAL
  Filled 2020-06-23 (×14): qty 1

## 2020-06-23 MED ORDER — LACTATED RINGERS IV BOLUS
500.0000 mL | Freq: Once | INTRAVENOUS | Status: AC
  Administered 2020-06-23: 500 mL via INTRAVENOUS

## 2020-06-23 MED ORDER — METHOCARBAMOL 500 MG PO TABS
1000.0000 mg | ORAL_TABLET | Freq: Three times a day (TID) | ORAL | Status: DC
  Administered 2020-06-23 – 2020-07-07 (×43): 1000 mg via ORAL
  Filled 2020-06-23 (×42): qty 2

## 2020-06-23 MED ORDER — HYDROMORPHONE HCL 1 MG/ML IJ SOLN
0.5000 mg | INTRAMUSCULAR | Status: DC | PRN
  Administered 2020-06-23 – 2020-06-25 (×4): 1 mg via INTRAVENOUS
  Filled 2020-06-23 (×4): qty 1

## 2020-06-23 MED ORDER — GABAPENTIN 100 MG PO CAPS
200.0000 mg | ORAL_CAPSULE | Freq: Three times a day (TID) | ORAL | Status: DC
  Administered 2020-06-23 – 2020-06-24 (×2): 200 mg via ORAL
  Filled 2020-06-23 (×2): qty 2

## 2020-06-23 MED ORDER — LORAZEPAM 2 MG/ML IJ SOLN
0.5000 mg | Freq: Once | INTRAMUSCULAR | Status: AC
  Administered 2020-06-23: 0.5 mg via INTRAVENOUS
  Filled 2020-06-23: qty 1

## 2020-06-23 MED ORDER — OXYCODONE HCL 5 MG PO TABS
10.0000 mg | ORAL_TABLET | ORAL | Status: DC | PRN
  Administered 2020-06-23 – 2020-06-28 (×8): 10 mg via ORAL
  Administered 2020-06-29 – 2020-07-02 (×12): 15 mg via ORAL
  Administered 2020-07-03: 10 mg via ORAL
  Administered 2020-07-03: 15 mg via ORAL
  Administered 2020-07-04: 10 mg via ORAL
  Administered 2020-07-04 – 2020-07-07 (×6): 15 mg via ORAL
  Filled 2020-06-23 (×2): qty 2
  Filled 2020-06-23 (×4): qty 3
  Filled 2020-06-23: qty 2
  Filled 2020-06-23: qty 3
  Filled 2020-06-23 (×2): qty 2
  Filled 2020-06-23: qty 3
  Filled 2020-06-23: qty 2
  Filled 2020-06-23 (×4): qty 3
  Filled 2020-06-23 (×2): qty 2
  Filled 2020-06-23 (×6): qty 3
  Filled 2020-06-23: qty 2
  Filled 2020-06-23 (×2): qty 3
  Filled 2020-06-23 (×2): qty 2
  Filled 2020-06-23 (×2): qty 3

## 2020-06-23 NOTE — Progress Notes (Signed)
   Vital Signs @MEWSNOTE @       06/23/2020,5:03 PM  The PA is aware of the increased heart rate.

## 2020-06-23 NOTE — Progress Notes (Signed)
Rehab Admissions Coordinator Note:  Patient was screened by Clois Dupes for appropriateness for an Inpatient Acute Rehab Consult per therapy recs.  At this time, we are recommending await definitive timing of ortho surgical plan before proceeding with consult.   Clois Dupes RN MSN 06/23/2020, 1:55 PM  I can be reached at [phone].

## 2020-06-23 NOTE — Progress Notes (Signed)
Heart rate of 138 documented via tele monitor- Patient denies any distress at this time but is having increased pain.

## 2020-06-23 NOTE — Progress Notes (Signed)
The patient was educated regarding her pain medication regimen.  The patient was under the impression that nursing staff would be bringing her pain medication every 2 hours but was educated in regards to PRN medication and asking for it when needed.  The patient appears anxious and has numerous complaints- toilet tissue being too rough, kleenex being too small and crying because her boyfriend has been arguing with her on the phone.

## 2020-06-23 NOTE — Progress Notes (Signed)
Leary Roca, PA here to assess .  Portable chest xray ordered

## 2020-06-23 NOTE — Progress Notes (Signed)
Heart rate 130's  Continuous cardiac monitor in place.  Patient has difficulty with anxiety and no improvement with pain.  PRN given as ordered.
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
## 2020-06-23 NOTE — Progress Notes (Signed)
Portable chest xray done.

## 2020-06-23 NOTE — Plan of Care (Signed)
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
## 2020-06-23 NOTE — Progress Notes (Signed)
Megan Maczis, PA notified of the patient's heart rate remaining in the 130's- 140's.  The patient does appear to be very anxious and having difficulty with pain management.  PRN pain medications given as ordered.  Continuous cardiac monitor in place with a rate of 133 sinus Tachycardia New orders will be received for anxiety.

## 2020-06-23 NOTE — Progress Notes (Signed)
Dr Sheliah Hatch sent a secure epic message with update regarding heart rate 120-130's- with no changes in status .

## 2020-06-23 NOTE — Progress Notes (Signed)
   06/23/20 1157  Vitals  Temp 98.3 F (36.8 C)  Temp Source Oral  BP 110/63  BP Location Right Arm  BP Method Automatic  Patient Position (if appropriate) Lying  ECG Heart Rate (!) 112  Resp 16  Level of Consciousness  Level of Consciousness Alert  MEWS COLOR  MEWS Score Color Yellow  Oxygen Therapy  SpO2 100 %  O2 Device Room Air  Pain Assessment  Pain Scale 0-10  Pain Score 7  Pain Type Surgical pain  Pain Location Leg  Pain Orientation Right;Left  Pain Intervention(s) Repositioned  PCA/Epidural/Spinal Assessment  Respiratory Pattern Regular;Unlabored  ECG Monitoring  Telemetry Box Number 5 North 06  MEWS Score  MEWS Temp 0  MEWS Systolic 0  MEWS Pulse 2  MEWS RR 0  MEWS LOC 0  MEWS Score 2     06/23/20 1157  Vitals  Temp 98.3 F (36.8 C)  Temp Source Oral  BP 110/63  BP Location Right Arm  BP Method Automatic  Patient Position (if appropriate) Lying  ECG Heart Rate (!) 112  Resp 16  Level of Consciousness  Level of Consciousness Alert  MEWS COLOR  MEWS Score Color Yellow  Oxygen Therapy  SpO2 100 %  O2 Device Room Air  Pain Assessment  Pain Scale 0-10  Pain Score 7  Pain Type Surgical pain  Pain Location Leg  Pain Orientation Right;Left  Pain Intervention(s) Repositioned  PCA/Epidural/Spinal Assessment  Respiratory Pattern Regular;Unlabored  ECG Monitoring  Telemetry Box Number 5 North 06  MEWS Score  MEWS Temp 0  MEWS Systolic 0  MEWS Pulse 2  MEWS RR 0  MEWS LOC 0  MEWS Score 2  No distress noted .

## 2020-06-23 NOTE — Progress Notes (Signed)
EKG completed

## 2020-06-23 NOTE — TOC CAGE-AID Note (Signed)
Transition of Care [facility]) - CAGE-AID Screening   Patient Details  Name: Megan Bailey MRN: [id number] Date of Birth: 1981-03-03  [facility] CM/SW Contact:    Emeterio Reeve, [place] Phone Number: 06/23/2020, 2:21 PM   Clinical Narrative: CSW met with pt at bedside. CSW introduced self and explained her role at the hospital.  PT denies alcohol use and substance use. Pt did not need any resources at this time.    CAGE-AID Screening:    Have You Ever Felt You Ought to Cut Down on Your Drinking or Drug Use?: No Have People Annoyed You By Critizing Your Drinking Or Drug Use?: No Have You Felt Bad Or Guilty About Your Drinking Or Drug Use?: No Have You Ever Had a Drink or Used Drugs First Thing In The Morning to Steady Your Nerves or to Get Rid of a Hangover?: No CAGE-AID Score: 0  Substance Abuse Education Offered: Yes    Blima Ledger, [place] Social Worker [phone]

## 2020-06-23 NOTE — Progress Notes (Signed)
Patient has continued with increased heart rate

## 2020-06-24 LAB — CBC
HCT: 26.2 % — ABNORMAL LOW (ref 36.0–46.0)
Hemoglobin: 8.3 g/dL — ABNORMAL LOW (ref 12.0–15.0)
MCH: 29.3 pg (ref 26.0–34.0)
MCHC: 31.7 g/dL (ref 30.0–36.0)
MCV: 92.6 fL (ref 80.0–100.0)
Platelets: 209 10*3/uL (ref 150–400)
RBC: 2.83 MIL/uL — ABNORMAL LOW (ref 3.87–5.11)
RDW: 13.2 % (ref 11.5–15.5)
WBC: 7.3 10*3/uL (ref 4.0–10.5)
nRBC: 0 % (ref 0.0–0.2)

## 2020-06-24 LAB — HEMOGLOBIN AND HEMATOCRIT, BLOOD
HCT: 27.7 % — ABNORMAL LOW (ref 36.0–46.0)
Hemoglobin: 8.9 g/dL — ABNORMAL LOW (ref 12.0–15.0)

## 2020-06-24 LAB — PREPARE RBC (CROSSMATCH)

## 2020-06-24 LAB — ABO/RH: ABO/RH(D): A POS

## 2020-06-24 MED ORDER — KETOROLAC TROMETHAMINE 15 MG/ML IJ SOLN
15.0000 mg | Freq: Three times a day (TID) | INTRAMUSCULAR | Status: AC
  Administered 2020-06-24 – 2020-06-27 (×9): 15 mg via INTRAVENOUS
  Filled 2020-06-24 (×9): qty 1

## 2020-06-24 MED ORDER — GABAPENTIN 300 MG PO CAPS
300.0000 mg | ORAL_CAPSULE | Freq: Three times a day (TID) | ORAL | Status: DC
  Administered 2020-06-24 – 2020-06-30 (×17): 300 mg via ORAL
  Filled 2020-06-24 (×17): qty 1

## 2020-06-24 MED ORDER — SODIUM CHLORIDE 0.9 % IV BOLUS: 1000.0000 mL | Freq: Once | INTRAVENOUS | Status: DC

## 2020-06-24 MED ORDER — VITAMIN D (ERGOCALCIFEROL) 1.25 MG (50000 UNIT) PO CAPS
50000.0000 [IU] | ORAL_CAPSULE | ORAL | Status: DC
  Administered 2020-06-24 – 2020-07-01 (×2): 50000 [IU] via ORAL
  Filled 2020-06-24 (×2): qty 1

## 2020-06-24 MED ORDER — SODIUM CHLORIDE 0.9% IV SOLUTION: Freq: Once | INTRAVENOUS | Status: AC

## 2020-06-24 MED ORDER — SODIUM CHLORIDE 0.9% IV SOLUTION: Freq: Once | INTRAVENOUS | Status: DC

## 2020-06-24 MED ORDER — ENOXAPARIN SODIUM 30 MG/0.3ML ~~LOC~~ SOLN
30.0000 mg | Freq: Two times a day (BID) | SUBCUTANEOUS | Status: DC
  Administered 2020-06-24 – 2020-07-07 (×26): 30 mg via SUBCUTANEOUS
  Filled 2020-06-24 (×26): qty 0.3
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
## 2020-06-24 NOTE — Plan of Care (Signed)
  Problem: Education: Goal: Knowledge of General Education information will improve Description: Including pain rating scale, medication(s)/side effects and non-pharmacologic comfort measures Outcome: Progressing   Problem: Education: Goal: Knowledge of General Education information will improve Description: Including pain rating scale, medication(s)/side effects and non-pharmacologic comfort measures Outcome: Progressing   Problem: Coping: Goal: Level of anxiety will decrease Outcome: Progressing   Problem: Pain Managment: Goal: General experience of comfort will improve Outcome: Progressing   Problem: Safety: Goal: Ability to remain free from injury will improve Outcome: Progressing   Problem: Skin Integrity: Goal: Risk for impaired skin integrity will decrease Outcome: Progressing   

## 2020-06-24 NOTE — Progress Notes (Addendum)
   06/24/20 1817  Assess: MEWS Score  Temp 100.1 F (37.8 C)  BP (!) 95/59 (RN notified)  Pulse Rate (!) 143 (RN nortified)  Resp 18  Level of Consciousness Alert  SpO2 94 %  O2 Device Room Air  Assess: MEWS Score  MEWS Temp 0  MEWS Systolic 1  MEWS Pulse 3  MEWS RR 0  MEWS LOC 0  MEWS Score 4  MEWS Score Color Red  Assess: if the MEWS score is Yellow or Red  Were vital signs taken at a resting state? Yes  Focused Assessment Change from prior assessment (see assessment flowsheet)  Early Detection of Sepsis Score *See Row Information* Low  MEWS guidelines implemented *See Row Information* Yes  Take Vital Signs  Increase Vital Sign Frequency  Red: Q 1hr X 4 then Q 4hr X 4, if remains red, continue Q 4hrs  Escalate  MEWS: Escalate Red: discuss with charge nurse/RN and provider, consider discussing with RRT  Notify: Charge Nurse/RN  Name of Charge Nurse/RN Notified Roj, RN  Date Charge Nurse/RN Notified 06/24/20  Time Charge Nurse/RN Notified 1817  Notify: Provider  Provider Name/Title Dr. Manus Rudd  Date Provider Notified 06/24/20  Time Provider Notified 1820  Notification Type Page  Notification Reason Change in status  Response See new orders  Date of Provider Response 06/24/20  Time of Provider Response 1821  Notify: Rapid Response  Name of Rapid Response RN Notified Luisa Hart, RN  Date Rapid Response Notified 06/24/20  Time Rapid Response Notified 1830    MD and Rapid RN notified. Received new orders from Dr. Corliss Skains. 1000cc NS bolus started at 18:35, discontinued NS bolus and started 1unit PRBC transfusion per MD. Vital signs taken and recorded. No adverse reactions noted. Pt alert, denies chest pain. Pt now resting comfortably in bed, endorsed accordingly to oncoming RN.

## 2020-06-24 NOTE — Plan of Care (Signed)
  Problem: Education: Goal: Knowledge of General Education information will improve Description: Including pain rating scale, medication(s)/side effects and non-pharmacologic comfort measures Outcome: Progressing   Problem: Health Behavior/Discharge Planning: Goal: Ability to manage health-related needs will improve Outcome: Progressing   Problem: Activity: Goal: Risk for activity intolerance will decrease Outcome: Progressing   Problem: Coping: Goal: Level of anxiety will decrease Outcome: Progressing   Problem: Elimination: Goal: Will not experience complications related to bowel motility Outcome: Progressing   Problem: Pain Managment: Goal: General experience of comfort will improve Outcome: Progressing   Problem: Safety: Goal: Ability to remain free from injury will improve Outcome: Progressing   Problem: Skin Integrity: Goal: Risk for impaired skin integrity will decrease Outcome: Progressing   

## 2020-06-24 NOTE — Progress Notes (Signed)
Pt had been refusing mobility and repositioning, educated on risks/complications of immobility. Pt encouraged to use bedpan but Pt refused and wants to keep the purewick.

## 2020-06-24 NOTE — Progress Notes (Signed)
One unit administered, called by on-call to hold off on the 2nd unit, H&H 2 hours post then contact with result. Passed to on coming RN.
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
## 2020-06-24 NOTE — Plan of Care (Signed)

## 2020-06-25 LAB — CBC
HCT: 28.6 % — ABNORMAL LOW (ref 36.0–46.0)
Hemoglobin: 9.2 g/dL — ABNORMAL LOW (ref 12.0–15.0)
MCH: 28.5 pg (ref 26.0–34.0)
MCHC: 32.2 g/dL (ref 30.0–36.0)
MCV: 88.5 fL (ref 80.0–100.0)
Platelets: 190 10*3/uL (ref 150–400)
RBC: 3.23 MIL/uL — ABNORMAL LOW (ref 3.87–5.11)
RDW: 14.8 % (ref 11.5–15.5)
WBC: 7.4 10*3/uL (ref 4.0–10.5)
nRBC: 0 % (ref 0.0–0.2)

## 2020-06-25 LAB — BPAM RBC
Blood Product Expiration Date: 202110022359
Blood Product Expiration Date: 202110022359
ISSUE DATE / TIME: 202109110347
ISSUE DATE / TIME: 202109111833
Unit Type and Rh: 6200
Unit Type and Rh: 6200

## 2020-06-25 LAB — TYPE AND SCREEN
ABO/RH(D): A POS
Antibody Screen: NEGATIVE
Unit division: 0
Unit division: 0

## 2020-06-25 LAB — BASIC METABOLIC PANEL
Anion gap: 5 (ref 5–15)
BUN: <5 mg/dL — ABNORMAL LOW (ref 6–20)
CO2: 26 mmol/L (ref 22–32)
Calcium: 7.3 mg/dL — ABNORMAL LOW (ref 8.9–10.3)
Chloride: 109 mmol/L (ref 98–111)
Creatinine, Ser: 0.57 mg/dL (ref 0.44–1.00)
GFR calc Af Amer: >60 mL/min (ref >60)
GFR calc non Af Amer: >60 mL/min (ref >60)
Glucose, Bld: 103 mg/dL — ABNORMAL HIGH (ref 70–99)
Potassium: 3.3 mmol/L — ABNORMAL LOW (ref 3.5–5.1)
Sodium: 140 mmol/L (ref 135–145)
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
## 2020-06-25 NOTE — Plan of Care (Addendum)
Encouraged Pt to use bedpan but Pt refused and wants to keep the purewick for today. Educated also on the importance of mobility. Pt states understanding.  Problem: Education: Goal: Knowledge of General Education information will improve Description: Including pain rating scale, medication(s)/side effects and non-pharmacologic comfort measures Outcome: Progressing   Problem: Health Behavior/Discharge Planning: Goal: Ability to manage health-related needs will improve Outcome: Progressing   Problem: Clinical Measurements: Goal: Ability to maintain clinical measurements within normal limits will improve Outcome: Progressing   Problem: Activity: Goal: Risk for activity intolerance will decrease Outcome: Progressing   Problem: Nutrition: Goal: Adequate nutrition will be maintained Outcome: Progressing   Problem: Coping: Goal: Level of anxiety will decrease Outcome: Progressing   Problem: Elimination: Goal: Will not experience complications related to bowel motility Outcome: Progressing   Problem: Pain Managment: Goal: General experience of comfort will improve Outcome: Progressing   Problem: Safety: Goal: Ability to remain free from injury will improve Outcome: Progressing   Problem: Skin Integrity: Goal: Risk for impaired skin integrity will decrease Outcome: Progressing
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
## 2020-06-25 NOTE — Progress Notes (Signed)
Sutures to left upper, below eyebrow removed. Site unremarkable. Patient tolerated procedure well.

## 2020-06-26 ENCOUNTER — Encounter (HOSPITAL_COMMUNITY): Payer: Self-pay | Admitting: Student

## 2020-06-26 LAB — CBC
HCT: 30.3 % — ABNORMAL LOW (ref 36.0–46.0)
Hemoglobin: 9.5 g/dL — ABNORMAL LOW (ref 12.0–15.0)
MCH: 28.2 pg (ref 26.0–34.0)
MCHC: 31.4 g/dL (ref 30.0–36.0)
MCV: 89.9 fL (ref 80.0–100.0)
Platelets: 244 10*3/uL (ref 150–400)
RBC: 3.37 MIL/uL — ABNORMAL LOW (ref 3.87–5.11)
RDW: 14.6 % (ref 11.5–15.5)
WBC: 6.4 10*3/uL (ref 4.0–10.5)
nRBC: 0 % (ref 0.0–0.2)

## 2020-06-26 MED ORDER — SODIUM CHLORIDE 0.9 % IV SOLN: INTRAVENOUS | Status: DC

## 2020-06-26 MED ORDER — HYDROMORPHONE HCL 1 MG/ML IJ SOLN
0.5000 mg | INTRAMUSCULAR | Status: DC | PRN
  Administered 2020-06-26 – 2020-06-29 (×3): 0.5 mg via INTRAVENOUS
  Filled 2020-06-26 (×3): qty 0.5

## 2020-06-26 MED ORDER — TRAMADOL HCL 50 MG PO TABS
50.0000 mg | ORAL_TABLET | Freq: Four times a day (QID) | ORAL | Status: DC
  Administered 2020-06-26 – 2020-06-29 (×11): 50 mg via ORAL
  Filled 2020-06-26 (×11): qty 1
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
## 2020-06-26 NOTE — Plan of Care (Signed)
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
## 2020-06-26 NOTE — Progress Notes (Signed)
Patient refuses to take the pure wick off. Stated 'it's too painful to move'. Of note, patient is on her monthly period. Patient also found to have few blisters and rash on left thigh where the knee immobilizer is on. Foam dressings are placed for cushion. Also patient c/o neck pain - warm packs and repositioning were offered with no relief. Left leg has kept elevated with ice packs. Educated patient to turn as much as she can tolerate to prevent any pressure injury, pt verbalized understanding. Will continue to monitor patient.
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
## 2020-06-26 NOTE — Plan of Care (Signed)

## 2020-06-27 MED ORDER — CEFAZOLIN SODIUM-DEXTROSE 2-4 GM/100ML-% IV SOLN
2.0000 g | INTRAVENOUS | Status: AC
  Administered 2020-06-28: 2 g via INTRAVENOUS
  Filled 2020-06-27 (×2): qty 100

## 2020-06-27 NOTE — Plan of Care (Addendum)
Pin care done per order. Encouraged to use bedpan instead of purewick but Pt continued to refuse. Encouraged to reposition and turn while in bed, Pt states understanding.   Problem: Education: Goal: Knowledge of General Education information will improve Description: Including pain rating scale, medication(s)/side effects and non-pharmacologic comfort measures Outcome: Progressing   Problem: Health Behavior/Discharge Planning: Goal: Ability to manage health-related needs will improve Outcome: Progressing   Problem: Clinical Measurements: Goal: Ability to maintain clinical measurements within normal limits will improve Outcome: Progressing   Problem: Activity: Goal: Risk for activity intolerance will decrease Outcome: Progressing   Problem: Coping: Goal: Level of anxiety will decrease Outcome: Progressing   Problem: Elimination: Goal: Will not experience complications related to bowel motility Outcome: Progressing   Problem: Pain Managment: Goal: General experience of comfort will improve Outcome: Progressing   Problem: Safety: Goal: Ability to remain free from injury will improve Outcome: Progressing   Problem: Skin Integrity: Goal: Risk for impaired skin integrity will decrease Outcome: Progressing
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
## 2020-06-28 ENCOUNTER — Encounter (HOSPITAL_COMMUNITY): Admission: EM | Disposition: A | Discharge status: Rehabilitation Facility | Payer: Self-pay | Source: Home / Self Care

## 2020-06-28 ENCOUNTER — Inpatient Hospital Stay (HOSPITAL_COMMUNITY): Payer: Medicaid Other | Admitting: Anesthesiology

## 2020-06-28 ENCOUNTER — Inpatient Hospital Stay (HOSPITAL_COMMUNITY): Payer: Medicaid Other

## 2020-06-28 ENCOUNTER — Encounter (HOSPITAL_COMMUNITY): Payer: Self-pay

## 2020-06-28 HISTORY — PX: OPEN REDUCTION INTERNAL FIXATION (ORIF) TIBIA/FIBULA FRACTURE: SHX5992

## 2020-06-28 SURGERY — OPEN REDUCTION INTERNAL FIXATION (ORIF) TIBIA/FIBULA FRACTURE
Anesthesia: Regional | Laterality: Left

## 2020-06-28 MED ORDER — DEXAMETHASONE SODIUM PHOSPHATE 10 MG/ML IJ SOLN
INTRAMUSCULAR | Status: DC | PRN
  Administered 2020-06-28: 10 mg via INTRAVENOUS

## 2020-06-28 MED ORDER — SUGAMMADEX SODIUM 200 MG/2ML IV SOLN
INTRAVENOUS | Status: DC | PRN
  Administered 2020-06-28: 200 mg via INTRAVENOUS

## 2020-06-28 MED ORDER — ACETAMINOPHEN 160 MG/5ML PO SOLN: 1000.0000 mg | Freq: Once | ORAL | Status: DC | PRN

## 2020-06-28 MED ORDER — PROPOFOL 10 MG/ML IV BOLUS
INTRAVENOUS | Status: AC
  Filled 2020-06-28: qty 20

## 2020-06-28 MED ORDER — OXYCODONE HCL 5 MG/5ML PO SOLN: 5.0000 mg | Freq: Once | ORAL | Status: DC | PRN

## 2020-06-28 MED ORDER — FENTANYL CITRATE (PF) 100 MCG/2ML IJ SOLN
INTRAMUSCULAR | Status: DC | PRN
Start: 2020-06-28 — End: 2020-06-28
  Administered 2020-06-28: 100 ug via INTRAVENOUS
  Administered 2020-06-28 (×2): 50 ug via INTRAVENOUS
  Administered 2020-06-28: 100 ug via INTRAVENOUS
  Administered 2020-06-28 (×2): 50 ug via INTRAVENOUS

## 2020-06-28 MED ORDER — VANCOMYCIN HCL 1000 MG IV SOLR
INTRAVENOUS | Status: DC | PRN
  Administered 2020-06-28: 1000 mg

## 2020-06-28 MED ORDER — ONDANSETRON HCL 4 MG/2ML IJ SOLN
INTRAMUSCULAR | Status: DC | PRN
  Administered 2020-06-28: 4 mg via INTRAVENOUS

## 2020-06-28 MED ORDER — LACTATED RINGERS IV SOLN: INTRAVENOUS | Status: DC | PRN

## 2020-06-28 MED ORDER — 0.9 % SODIUM CHLORIDE (POUR BTL) OPTIME
TOPICAL | Status: DC | PRN
  Administered 2020-06-28: 1000 mL

## 2020-06-28 MED ORDER — DEXAMETHASONE SODIUM PHOSPHATE 10 MG/ML IJ SOLN
INTRAMUSCULAR | Status: AC
  Filled 2020-06-28: qty 1

## 2020-06-28 MED ORDER — PROPOFOL 10 MG/ML IV BOLUS
INTRAVENOUS | Status: DC | PRN
  Administered 2020-06-28: 160 mg via INTRAVENOUS

## 2020-06-28 MED ORDER — VANCOMYCIN HCL 1000 MG IV SOLR
INTRAVENOUS | Status: AC
  Filled 2020-06-28: qty 1000

## 2020-06-28 MED ORDER — WHITE PETROLATUM EX OINT
TOPICAL_OINTMENT | CUTANEOUS | Status: AC
  Filled 2020-06-28: qty 28.35

## 2020-06-28 MED ORDER — ROCURONIUM BROMIDE 10 MG/ML (PF) SYRINGE
PREFILLED_SYRINGE | INTRAVENOUS | Status: DC | PRN
  Administered 2020-06-28: 20 mg via INTRAVENOUS
  Administered 2020-06-28: 10 mg via INTRAVENOUS
  Administered 2020-06-28: 70 mg via INTRAVENOUS

## 2020-06-28 MED ORDER — BUPIVACAINE HCL (PF) 0.5 % IJ SOLN
INTRAMUSCULAR | Status: AC
  Filled 2020-06-28: qty 30

## 2020-06-28 MED ORDER — MIDAZOLAM HCL 5 MG/5ML IJ SOLN
INTRAMUSCULAR | Status: DC | PRN
  Administered 2020-06-28: 2 mg via INTRAVENOUS

## 2020-06-28 MED ORDER — FENTANYL CITRATE (PF) 100 MCG/2ML IJ SOLN
INTRAMUSCULAR | Status: AC
  Filled 2020-06-28: qty 2

## 2020-06-28 MED ORDER — FENTANYL CITRATE (PF) 100 MCG/2ML IJ SOLN
25.0000 ug | INTRAMUSCULAR | Status: DC | PRN
  Administered 2020-06-28 (×2): 50 ug via INTRAVENOUS

## 2020-06-28 MED ORDER — ROCURONIUM BROMIDE 10 MG/ML (PF) SYRINGE
PREFILLED_SYRINGE | INTRAVENOUS | Status: AC
  Filled 2020-06-28: qty 10

## 2020-06-28 MED ORDER — ALBUMIN HUMAN 5 % IV SOLN: INTRAVENOUS | Status: DC | PRN

## 2020-06-28 MED ORDER — DOUBLE ANTIBIOTIC 500-10000 UNIT/GM EX OINT
TOPICAL_OINTMENT | CUTANEOUS | Status: AC
  Filled 2020-06-28: qty 28.4

## 2020-06-28 MED ORDER — ONDANSETRON HCL 4 MG/2ML IJ SOLN
INTRAMUSCULAR | Status: AC
  Filled 2020-06-28: qty 2

## 2020-06-28 MED ORDER — ACETAMINOPHEN 10 MG/ML IV SOLN: 1000.0000 mg | Freq: Once | INTRAVENOUS | Status: DC | PRN

## 2020-06-28 MED ORDER — CEFAZOLIN SODIUM-DEXTROSE 2-4 GM/100ML-% IV SOLN
2.0000 g | Freq: Three times a day (TID) | INTRAVENOUS | Status: AC
  Administered 2020-06-28 – 2020-06-29 (×3): 2 g via INTRAVENOUS
  Filled 2020-06-28 (×4): qty 100

## 2020-06-28 MED ORDER — FENTANYL CITRATE (PF) 250 MCG/5ML IJ SOLN
INTRAMUSCULAR | Status: AC
  Filled 2020-06-28: qty 5

## 2020-06-28 MED ORDER — LIDOCAINE 2% (20 MG/ML) 5 ML SYRINGE
INTRAMUSCULAR | Status: AC
  Filled 2020-06-28: qty 5

## 2020-06-28 MED ORDER — LIDOCAINE 2% (20 MG/ML) 5 ML SYRINGE
INTRAMUSCULAR | Status: DC | PRN
  Administered 2020-06-28: 70 mg via INTRAVENOUS

## 2020-06-28 MED ORDER — OXYCODONE HCL 5 MG PO TABS: 5.0000 mg | ORAL_TABLET | Freq: Once | ORAL | Status: DC | PRN

## 2020-06-28 MED ORDER — ACETAMINOPHEN 500 MG PO TABS: 1000.0000 mg | ORAL_TABLET | Freq: Once | ORAL | Status: DC | PRN

## 2020-06-28 MED ORDER — MIDAZOLAM HCL 2 MG/2ML IJ SOLN
INTRAMUSCULAR | Status: AC
  Filled 2020-06-28: qty 2

## 2020-06-28 SURGICAL SUPPLY — 84 items
APL PRP STRL LF DISP 70% ISPRP (MISCELLANEOUS) ×1
BANDAGE ESMARK 6X9 LF (GAUZE/BANDAGES/DRESSINGS) ×1 IMPLANT
BIT DRILL 2.5X2.75 QC CALB (BIT) ×2 IMPLANT
BIT DRILL CALIBRATED 2.7 (BIT) ×1 IMPLANT
BIT DRILL CALIBRATED 2.7MM (BIT) ×1
BNDG CMPR 9X6 STRL LF SNTH (GAUZE/BANDAGES/DRESSINGS) ×1
BNDG CMPR MED 15X6 ELC VLCR LF (GAUZE/BANDAGES/DRESSINGS) ×1
BNDG COHESIVE 4X5 TAN STRL (GAUZE/BANDAGES/DRESSINGS) ×3 IMPLANT
BNDG ELASTIC 4X5.8 VLCR STR LF (GAUZE/BANDAGES/DRESSINGS) ×2 IMPLANT
BNDG ELASTIC 6X15 VLCR STRL LF (GAUZE/BANDAGES/DRESSINGS) ×2 IMPLANT
BNDG ESMARK 6X9 LF (GAUZE/BANDAGES/DRESSINGS) ×3
BONE CANC CHIPS 20CC PCAN1/4 (Bone Implant) ×3 IMPLANT
BRUSH SCRUB EZ PLAIN DRY (MISCELLANEOUS) ×6 IMPLANT
CHIPS CANC BONE 20CC PCAN1/4 (Bone Implant) ×1 IMPLANT
CHLORAPREP W/TINT 26 (MISCELLANEOUS) ×3 IMPLANT
CNTNR URN SCR LID CUP LEK RST (MISCELLANEOUS) IMPLANT
CONT SPEC 4OZ STRL OR WHT (MISCELLANEOUS) ×3
COVER SURGICAL LIGHT HANDLE (MISCELLANEOUS) ×3 IMPLANT
COVER WAND RF STERILE (DRAPES) ×3 IMPLANT
DRAPE C-ARM 42X72 X-RAY (DRAPES) ×3 IMPLANT
DRAPE C-ARMOR (DRAPES) ×3 IMPLANT
DRAPE ORTHO SPLIT 77X108 STRL (DRAPES) ×6
DRAPE SURG ORHT 6 SPLT 77X108 (DRAPES) ×2 IMPLANT
DRAPE U-SHAPE 47X51 STRL (DRAPES) ×3 IMPLANT
DRSG MEPITEL 4X7.2 (GAUZE/BANDAGES/DRESSINGS) ×2 IMPLANT
ELECT REM PT RETURN 9FT ADLT (ELECTROSURGICAL) ×3
ELECTRODE REM PT RTRN 9FT ADLT (ELECTROSURGICAL) ×1 IMPLANT
GAUZE SPONGE 4X4 12PLY STRL (GAUZE/BANDAGES/DRESSINGS) ×2 IMPLANT
GLOVE BIO SURGEON STRL SZ 6.5 (GLOVE) ×6 IMPLANT
GLOVE BIO SURGEON STRL SZ7.5 (GLOVE) ×9 IMPLANT
GLOVE BIO SURGEONS STRL SZ 6.5 (GLOVE) ×3
GLOVE BIOGEL PI IND STRL 6.5 (GLOVE) ×1 IMPLANT
GLOVE BIOGEL PI IND STRL 7.5 (GLOVE) ×1 IMPLANT
GLOVE BIOGEL PI INDICATOR 6.5 (GLOVE) ×2
GLOVE BIOGEL PI INDICATOR 7.5 (GLOVE) ×2
GOWN STRL REUS W/ TWL LRG LVL3 (GOWN DISPOSABLE) ×2 IMPLANT
GOWN STRL REUS W/TWL LRG LVL3 (GOWN DISPOSABLE) ×6
GRAFT BNE CANC CHIPS 1-8 20CC (Bone Implant) IMPLANT
K-WIRE ACE 1.6X6 (WIRE) ×15
KIT TURNOVER KIT B (KITS) ×3 IMPLANT
KWIRE ACE 1.6X6 (WIRE) IMPLANT
MANIFOLD NEPTUNE II (INSTRUMENTS) ×3 IMPLANT
NDL HYPO 21X1.5 SAFETY (NEEDLE) IMPLANT
NDL HYPO 25GX1X1/2 BEV (NEEDLE) ×1 IMPLANT
NEEDLE HYPO 21X1.5 SAFETY (NEEDLE) ×3 IMPLANT
NEEDLE HYPO 25GX1X1/2 BEV (NEEDLE) ×3 IMPLANT
NS IRRIG 1000ML POUR BTL (IV SOLUTION) ×3 IMPLANT
PACK TOTAL JOINT (CUSTOM PROCEDURE TRAY) ×3 IMPLANT
PAD ARMBOARD 7.5X6 YLW CONV (MISCELLANEOUS) ×6 IMPLANT
PAD CAST 4YDX4 CTTN HI CHSV (CAST SUPPLIES) IMPLANT
PADDING CAST COTTON 4X4 STRL (CAST SUPPLIES) ×3
PADDING CAST COTTON 6X4 STRL (CAST SUPPLIES) ×2 IMPLANT
PLATE 9H LT DIST ANTLAT TIB (Plate) ×3 IMPLANT
PLATE ANTLAT CNTR 156X9 (Plate) IMPLANT
PLATE LOCK 6H 77 BILAT FIB (Plate) ×2 IMPLANT
SCREW CORTICAL 3.5MM  28MM (Screw) ×4 IMPLANT
SCREW CORTICAL 3.5MM 26MM (Screw) ×6 IMPLANT
SCREW CORTICAL 3.5MM 28MM (Screw) IMPLANT
SCREW LOCK CORT STAR 3.5X10 (Screw) ×2 IMPLANT
SCREW LOCK CORT STAR 3.5X12 (Screw) ×2 IMPLANT
SCREW LOCK CORT STAR 3.5X18 (Screw) ×2 IMPLANT
SCREW LOCK CORT STAR 3.5X34 (Screw) ×2 IMPLANT
SCREW LOCK CORT STAR 3.5X40 (Screw) ×2 IMPLANT
SCREW LOCK CORT STAR 3.5X42 (Screw) ×2 IMPLANT
SCREW LOCK CORT STAR 3.5X46 (Screw) ×4 IMPLANT
SCREW LOW PROFILE 3.5X30MM TIS (Screw) ×2 IMPLANT
SCREW LP NL T15 3.5X26 (Screw) ×2 IMPLANT
SCREW T15 LP CORT 3.5X46MM NS (Screw) ×2 IMPLANT
SPONGE LAP 18X18 RF (DISPOSABLE) ×2 IMPLANT
STAPLER VISISTAT 35W (STAPLE) ×3 IMPLANT
SUCTION FRAZIER HANDLE 10FR (MISCELLANEOUS) ×2
SUCTION TUBE FRAZIER 10FR DISP (MISCELLANEOUS) ×1 IMPLANT
SUT ETHILON 2 0 FS 18 (SUTURE) ×2 IMPLANT
SUT ETHILON 3 0 PS 1 (SUTURE) ×6 IMPLANT
SUT VIC AB 0 CT1 27 (SUTURE) ×6
SUT VIC AB 0 CT1 27XBRD ANBCTR (SUTURE) ×1 IMPLANT
SUT VIC AB 2-0 CT1 27 (SUTURE) ×6
SUT VIC AB 2-0 CT1 TAPERPNT 27 (SUTURE) ×2 IMPLANT
SYR 10ML LL (SYRINGE) ×2 IMPLANT
SYR CONTROL 10ML LL (SYRINGE) ×3 IMPLANT
TOWEL GREEN STERILE (TOWEL DISPOSABLE) ×6 IMPLANT
TOWEL GREEN STERILE FF (TOWEL DISPOSABLE) ×3 IMPLANT
UNDERPAD 30X36 HEAVY ABSORB (UNDERPADS AND DIAPERS) ×3 IMPLANT
WATER STERILE IRR 1000ML POUR (IV SOLUTION) ×3 IMPLANT
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
## 2020-06-28 NOTE — Transfer of Care (Signed)
Immediate Anesthesia Transfer of Care Note  Patient: Megan Bailey  Procedure(s) Performed: OPEN REDUCTION INTERNAL FIXATION (ORIF) PILON FRACTURE (Left )  Patient Location: PACU  Anesthesia Type:General  Level of Consciousness: awake, oriented and patient cooperative  Airway & Oxygen Therapy: Patient Spontanous Breathing and Patient connected to nasal cannula oxygen  Post-op Assessment: Report given to RN and Post -op Vital signs reviewed and stable  Post vital signs: Reviewed  Last Vitals:  Vitals Value Taken Time  BP 133/73 06/28/20 1127  Temp    Pulse 132 06/28/20 1128  Resp 20 06/28/20 1128  SpO2 100 % 06/28/20 1128  Vitals shown include unvalidated device data.  Last Pain:  Vitals:   06/28/20 0343  TempSrc: Oral  PainSc:       Patients Stated Pain Goal: 0 (06/26/20 2138)  Complications: No complications documented.

## 2020-06-28 NOTE — Plan of Care (Signed)

## 2020-06-28 NOTE — Progress Notes (Signed)
Orthopedic Tech Progress Note Patient Details:  Megan Bailey 02/25/1981 [id number] Called in order to [employer] for a HINGED KNEE BRACE Patient ID: Megan Bailey, female   DOB: 04/16/1981, 39 y.o.   MRN: [id number]   Donald Pore 06/28/2020, 12:51 PM

## 2020-06-28 NOTE — Anesthesia Procedure Notes (Signed)
Procedure Name: Intubation Date/Time: 06/28/2020 8:55 AM Performed by: Lovie Chol, CRNA Pre-anesthesia Checklist: Patient identified, Emergency Drugs available, Suction available and Patient being monitored Patient Re-evaluated:Patient Re-evaluated prior to induction Oxygen Delivery Method: Circle System Utilized Preoxygenation: Pre-oxygenation with 100% oxygen Induction Type: IV induction Ventilation: Mask ventilation without difficulty Laryngoscope Size: Miller and 2 Grade View: Grade I Tube type: Oral Tube size: 7.0 mm Number of attempts: 1 Airway Equipment and Method: Stylet and Oral airway Placement Confirmation: ETT inserted through vocal cords under direct vision,  positive ETCO2 and breath sounds checked- equal and bilateral Secured at: 21 cm Tube secured with: Tape Dental Injury: Teeth and Oropharynx as per pre-operative assessment
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
## 2020-06-28 NOTE — Anesthesia Preprocedure Evaluation (Addendum)
Anesthesia Evaluation  Patient identified by MRN, date of birth, ID band Patient awake    Reviewed: Allergy & Precautions, NPO status , Patient's Chart, lab work & pertinent test results  History of Anesthesia Complications Negative for: history of anesthetic complications  Airway Mallampati: II  TM Distance: >3 FB Neck ROM: Full    Dental  (+) Dental Advisory Given   Pulmonary neg recent URI, former smoker,  Covid-19 Nucleic Acid Test Results Lab Results      Component                Value               Date                      SARSCOV2NAA              NEGATIVE            06/22/2020              breath sounds clear to auscultation       Cardiovascular negative cardio ROS   Rhythm:Regular     Neuro/Psych negative neurological ROS  negative psych ROS   GI/Hepatic negative GI ROS, Neg liver ROS,   Endo/Other  negative endocrine ROS  Renal/GU negative Renal ROS     Musculoskeletal negative musculoskeletal ROS (+)   Abdominal   Peds  Hematology negative hematology ROS (+)   Anesthesia Other Findings   Reproductive/Obstetrics                             Anesthesia Physical Anesthesia Plan  ASA: I  Anesthesia Plan: General   Post-op Pain Management:    Induction: Intravenous  PONV Risk Score and Plan: Ondansetron and Dexamethasone  Airway Management Planned: LMA and Oral ETT  Additional Equipment: None  Intra-op Plan:   Post-operative Plan: Extubation in OR  Informed Consent: I have reviewed the patients History and Physical, chart, labs and discussed the procedure including the risks, benefits and alternatives for the proposed anesthesia with the patient or authorized representative who has indicated his/her understanding and acceptance.     Dental advisory given  Plan Discussed with: CRNA and Surgeon  Anesthesia Plan Comments:         Anesthesia Quick  Evaluation

## 2020-06-28 NOTE — Progress Notes (Signed)
1230 Received pt from PACU, A&O x4. LLE with ace wrap dry and intact, with knee immobilizer on. LLE is still numb due to nerve block.  Skin assessment done. Pt has opened blisters to right buttock and the back of bil thighs. Foam dressings applied. Wound care order placed.

## 2020-06-28 NOTE — Interval H&P Note (Signed)
Risks and benefits discussed with patient regarding ORIF of left pilon. She agrees to proceed and consent obtained.  Truitt Merle MD

## 2020-06-29 ENCOUNTER — Encounter (HOSPITAL_COMMUNITY): Payer: Self-pay | Admitting: Student

## 2020-06-29 LAB — CBC
HCT: 27.4 % — ABNORMAL LOW (ref 36.0–46.0)
Hemoglobin: 8.6 g/dL — ABNORMAL LOW (ref 12.0–15.0)
MCH: 28.2 pg (ref 26.0–34.0)
MCHC: 31.4 g/dL (ref 30.0–36.0)
MCV: 89.8 fL (ref 80.0–100.0)
Platelets: 342 10*3/uL (ref 150–400)
RBC: 3.05 MIL/uL — ABNORMAL LOW (ref 3.87–5.11)
RDW: 14.5 % (ref 11.5–15.5)
WBC: 9.3 10*3/uL (ref 4.0–10.5)
nRBC: 0 % (ref 0.0–0.2)

## 2020-06-29 LAB — BASIC METABOLIC PANEL
Anion gap: 8 (ref 5–15)
BUN: 8 mg/dL (ref 6–20)
CO2: 26 mmol/L (ref 22–32)
Calcium: 8 mg/dL — ABNORMAL LOW (ref 8.9–10.3)
Chloride: 103 mmol/L (ref 98–111)
Creatinine, Ser: 0.55 mg/dL (ref 0.44–1.00)
GFR calc Af Amer: >60 mL/min (ref >60)
GFR calc non Af Amer: >60 mL/min (ref >60)
Glucose, Bld: 103 mg/dL — ABNORMAL HIGH (ref 70–99)
Potassium: 3.8 mmol/L (ref 3.5–5.1)
Sodium: 137 mmol/L (ref 135–145)

## 2020-06-29 MED ORDER — LORAZEPAM 2 MG/ML IJ SOLN: 1.0000 mg | Freq: Four times a day (QID) | INTRAMUSCULAR | Status: DC | PRN

## 2020-06-29 MED ORDER — MEDERMA EX GEL
Freq: Every day | CUTANEOUS | Status: DC
  Filled 2020-06-29: qty 20

## 2020-06-29 MED ORDER — LUBRIDERM SERIOUSLY SENSITIVE EX LOTN
TOPICAL_LOTION | Freq: Every day | CUTANEOUS | Status: DC
  Administered 2020-06-30: 1 via TOPICAL
  Filled 2020-06-29: qty 473

## 2020-06-29 MED ORDER — TRAMADOL HCL 50 MG PO TABS
100.0000 mg | ORAL_TABLET | Freq: Four times a day (QID) | ORAL | Status: DC
  Administered 2020-06-29 – 2020-07-07 (×32): 100 mg via ORAL
  Filled 2020-06-29 (×33): qty 2

## 2020-06-29 MED ORDER — HYDROMORPHONE HCL 1 MG/ML IJ SOLN
0.5000 mg | Freq: Four times a day (QID) | INTRAMUSCULAR | Status: DC | PRN
  Administered 2020-06-29 – 2020-06-30 (×2): 0.5 mg via INTRAVENOUS
  Filled 2020-06-29 (×2): qty 0.5
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
## 2020-06-29 NOTE — Plan of Care (Addendum)
Educated patient in making frequent turns to prevent breakdown, patient states it hurts to much to do so. Foam pads applied. Will continue to monitor. Also educated patient to continuing icing knees to help with swelling and movement.    Problem: Education: Goal: Knowledge of General Education information will improve Description: Including pain rating scale, medication(s)/side effects and non-pharmacologic comfort measures Outcome: Progressing   Problem: Pain Managment: Goal: General experience of comfort will improve Outcome: Progressing   Problem: Safety: Goal: Ability to remain free from injury will improve Outcome: Progressing   Problem: Skin Integrity: Goal: Risk for impaired skin integrity will decrease Outcome: Progressing
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
## 2020-06-30 LAB — CBC
HCT: 31.1 % — ABNORMAL LOW (ref 36.0–46.0)
Hemoglobin: 9.6 g/dL — ABNORMAL LOW (ref 12.0–15.0)
MCH: 28.7 pg (ref 26.0–34.0)
MCHC: 30.9 g/dL (ref 30.0–36.0)
MCV: 92.8 fL (ref 80.0–100.0)
Platelets: 415 10*3/uL — ABNORMAL HIGH (ref 150–400)
RBC: 3.35 MIL/uL — ABNORMAL LOW (ref 3.87–5.11)
RDW: 14.5 % (ref 11.5–15.5)
WBC: 7.7 10*3/uL (ref 4.0–10.5)
nRBC: 0 % (ref 0.0–0.2)

## 2020-06-30 MED ORDER — SULFAMETHOXAZOLE-TRIMETHOPRIM 800-160 MG PO TABS
1.0000 | ORAL_TABLET | Freq: Two times a day (BID) | ORAL | Status: DC
  Administered 2020-06-30: 1 via ORAL
  Filled 2020-06-30: qty 1

## 2020-06-30 MED ORDER — GABAPENTIN 400 MG PO CAPS
400.0000 mg | ORAL_CAPSULE | Freq: Three times a day (TID) | ORAL | Status: DC
  Administered 2020-06-30 – 2020-07-01 (×3): 400 mg via ORAL
  Filled 2020-06-30 (×3): qty 1

## 2020-06-30 MED ORDER — SULFAMETHOXAZOLE-TRIMETHOPRIM 800-160 MG PO TABS
1.0000 | ORAL_TABLET | Freq: Two times a day (BID) | ORAL | Status: AC
  Administered 2020-06-30 – 2020-07-02 (×5): 1 via ORAL
  Filled 2020-06-30 (×5): qty 1

## 2020-06-30 NOTE — Plan of Care (Signed)
  Problem: Education: Goal: Knowledge of General Education information will improve Description Including pain rating scale, medication(s)/side effects and non-pharmacologic comfort measures Outcome: Progressing   Problem: Education: Goal: Knowledge of General Education information will improve Description Including pain rating scale, medication(s)/side effects and non-pharmacologic comfort measures Outcome: Progressing   

## 2020-06-30 NOTE — Plan of Care (Addendum)
Pt encouraged to use bedpan instead of purewick but she states it's too painful for her to be turning from side to side and prefers the purewick.  Foam dressing applied to blisters on R buttocks and legs but Pt had removed dressings to R buttocks and states it would heal better that way. Pt also removed some of the foam dressing on R leg and left 1 foam dressing in place.  Problem: Health Behavior/Discharge Planning: Goal: Ability to manage health-related needs will improve Outcome: Progressing   Problem: Clinical Measurements: Goal: Ability to maintain clinical measurements within normal limits will improve Outcome: Progressing   Problem: Activity: Goal: Risk for activity intolerance will decrease Outcome: Progressing   Problem: Nutrition: Goal: Adequate nutrition will be maintained Outcome: Progressing   Problem: Coping: Goal: Level of anxiety will decrease Outcome: Progressing   Problem: Elimination: Goal: Will not experience complications related to bowel motility Outcome: Progressing   Problem: Pain Managment: Goal: General experience of comfort will improve Outcome: Progressing   Problem: Safety: Goal: Ability to remain free from injury will improve Outcome: Progressing   Problem: Skin Integrity: Goal: Risk for impaired skin integrity will decrease Outcome: Progressing
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
## 2020-07-01 LAB — CBC
HCT: 31.8 % — ABNORMAL LOW (ref 36.0–46.0)
Hemoglobin: 9.9 g/dL — ABNORMAL LOW (ref 12.0–15.0)
MCH: 28.3 pg (ref 26.0–34.0)
MCHC: 31.1 g/dL (ref 30.0–36.0)
MCV: 90.9 fL (ref 80.0–100.0)
Platelets: 448 10*3/uL — ABNORMAL HIGH (ref 150–400)
RBC: 3.5 MIL/uL — ABNORMAL LOW (ref 3.87–5.11)
RDW: 14.2 % (ref 11.5–15.5)
WBC: 7.5 10*3/uL (ref 4.0–10.5)
nRBC: 0 % (ref 0.0–0.2)

## 2020-07-01 MED ORDER — GABAPENTIN 300 MG PO CAPS
600.0000 mg | ORAL_CAPSULE | Freq: Three times a day (TID) | ORAL | Status: DC
  Administered 2020-07-01 – 2020-07-07 (×19): 600 mg via ORAL
  Filled 2020-07-01 (×19): qty 2
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
## 2020-07-01 NOTE — Progress Notes (Signed)
Inpatient Rehab Admissions Coordinator:   I have met with Pt. To discuss potential CIR admit. Pt. Expressed concern that she cannot manage 3 hours of therapy every day and states that she has only her boyfriend at home for support, but he works during the day and she does not have anyone who could stay with her while he works. I do not anticipate that Pt. Will be abe to reach mod I level by time of discharge, so CIR is not an option for her. Recommend SNF placement for Pt. CIR will sign off.   Clemens Catholic, [place], [place] Admissions Coordinator  [phone] ([place]) [phone] (office)

## 2020-07-01 NOTE — Plan of Care (Signed)
Educating patient to continue turning and staying off her bottom for long periods of time. Patient states it is painful, but will try. Need foams applied.  Problem: Education: Goal: Knowledge of General Education information will improve Description: Including pain rating scale, medication(s)/side effects and non-pharmacologic comfort measures Outcome: Progressing   Problem: Activity: Goal: Risk for activity intolerance will decrease Outcome: Progressing   Problem: Nutrition: Goal: Adequate nutrition will be maintained Outcome: Progressing   Problem: Coping: Goal: Level of anxiety will decrease Outcome: Progressing   Problem: Pain Managment: Goal: General experience of comfort will improve Outcome: Progressing   Problem: Safety: Goal: Ability to remain free from injury will improve Outcome: Progressing   Problem: Skin Integrity: Goal: Risk for impaired skin integrity will decrease Outcome: Progressing

## 2020-07-02 NOTE — Plan of Care (Signed)
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
## 2020-07-03 ENCOUNTER — Encounter (HOSPITAL_COMMUNITY): Payer: Self-pay | Admitting: Student

## 2020-07-03 MED ORDER — BUPIVACAINE-EPINEPHRINE (PF) 0.5% -1:200000 IJ SOLN
INTRAMUSCULAR | Status: DC | PRN
  Administered 2020-06-28: 30 mL via PERINEURAL

## 2020-07-03 NOTE — Anesthesia Postprocedure Evaluation (Addendum)
Anesthesia Post Note  Patient: Megan Bailey  Procedure(s) Performed: OPEN REDUCTION INTERNAL FIXATION (ORIF) PILON FRACTURE (Left )     Patient location during evaluation: PACU Anesthesia Type: General and Regional Level of consciousness: awake and alert Pain management: pain level controlled Vital Signs Assessment: post-procedure vital signs reviewed and stable Respiratory status: spontaneous breathing, nonlabored ventilation, respiratory function stable and patient connected to nasal cannula oxygen Cardiovascular status: blood pressure returned to baseline and stable Postop Assessment: no apparent nausea or vomiting Anesthetic complications: no   No complications documented.  Last Vitals:  Vitals:   07/03/20 0730 07/03/20 1327  BP: 101/67 105/60  Pulse: 91 91  Resp: 15 18  Temp: 36.8 C 36.8 C  SpO2: 99% 98%    Last Pain:  Vitals:   07/03/20 1327  TempSrc: Oral  PainSc:                  [name]

## 2020-07-03 NOTE — Anesthesia Procedure Notes (Signed)
Anesthesia Regional Block: Popliteal block   Pre-Anesthetic Checklist: ,, timeout performed, Correct Patient, Correct Site, Correct Laterality, Correct Procedure, Correct Position, site marked, Risks and benefits discussed,  Surgical consent,  Pre-op evaluation,  At surgeon's request and post-op pain management  Laterality: Left and Lower  Prep: chloraprep       Needles:  Injection technique: Single-shot     Needle Length: 9cm  Needle Gauge: 22     Additional Needles: Arrow StimuQuik ECHO Echogenic Stimulating PNB Needle  Procedures:,,,, ultrasound used (permanent image in chart),,,,  Narrative:  Start time: 06/28/2020 11:05 AM End time: 06/28/2020 11:09 AM Injection made incrementally with aspirations every 5 mL.  Performed by: Personally  Anesthesiologist: Val Eagle, MD
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
## 2020-07-03 NOTE — Plan of Care (Signed)
  Problem: Education: Goal: Knowledge of General Education information will improve Description: Including pain rating scale, medication(s)/side effects and non-pharmacologic comfort measures Outcome: Progressing   Problem: Education: Goal: Knowledge of General Education information will improve Description: Including pain rating scale, medication(s)/side effects and non-pharmacologic comfort measures Outcome: Progressing   Problem: Clinical Measurements: Goal: Ability to maintain clinical measurements within normal limits will improve Outcome: Progressing Goal: Will remain free from infection Outcome: Progressing

## 2020-07-03 NOTE — Progress Notes (Signed)
PT Cancellation Note  Patient Details Name: Megan Bailey MRN: [id number] DOB: 10/10/1981   Cancelled Treatment:    Reason Eval/Treat Not Completed: Pain limiting ability to participate;Fatigue/lethargy limiting ability to participate;Other (comment). Pt with reports of 7-8/10 bil pain, RN notified. Pt then stated she needed to pee. Offered to assist pt onto [facility] via posterior transfer or stand pivot with RW. Pt preferring to use purewick at this time. Pt then stating she did not sleep well last night and it's too early. Requested PT return at another time. Will follow up today as time allows vs another date.    Sallyanne Kuster 07/03/2020, 9:27 AM  Sallyanne Kuster, PTA, CLT Acute Rehab Services [phone] 07/03/20, 9:28 AM
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
## 2020-07-04 NOTE — Plan of Care (Signed)

## 2020-07-04 NOTE — Social Work (Signed)
CSW attempted to complete assessment, Pt was sleep and unable to wake. CSW will try again.   Jimmy Picket, Theresia Majors, [place] Clinical Social Worker [phone]
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
## 2020-07-04 NOTE — Plan of Care (Signed)
  Problem: Health Behavior/Discharge Planning: Goal: Ability to manage health-related needs will improve Outcome: Progressing   Problem: Coping: Goal: Level of anxiety will decrease Outcome: Progressing   Problem: Pain Managment: Goal: General experience of comfort will improve Outcome: Progressing   Problem: Safety: Goal: Ability to remain free from injury will improve Outcome: Progressing   
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
## 2020-07-06 NOTE — Plan of Care (Signed)

## 2020-07-06 NOTE — Plan of Care (Signed)

## 2020-07-06 NOTE — Progress Notes (Signed)
IP rehab admissions - I received a message from PT/OT that patient was progressing well and could potentially admit to CIR with goals of Mod I.  I met with patient and placed her back on rehab consult list.  Patient did well with therapy today.  Patient now feels that if she had a couple weeks on CIR that she could stay by herself during the day with help from her boyfriend evenings and nights.  Her family are all in [place].  I will have my partner follow up tomorrow.  Call me for questions.  [phone]
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
## 2020-07-06 NOTE — Plan of Care (Signed)

## 2020-07-06 NOTE — Plan of Care (Signed)
  Problem: Education: Goal: Knowledge of General Education information will improve Description: Including pain rating scale, medication(s)/side effects and non-pharmacologic comfort measures 07/06/2020 1006 by Penelope Galas, RN Outcome: Progressing 07/06/2020 0739 by Penelope Galas, RN Outcome: Progressing   Problem: Education: Goal: Knowledge of General Education information will improve Description: Including pain rating scale, medication(s)/side effects and non-pharmacologic comfort measures 07/06/2020 1006 by Penelope Galas, RN Outcome: Progressing 07/06/2020 0739 by Penelope Galas, RN Outcome: Progressing   Problem: Health Behavior/Discharge Planning: Goal: Ability to manage health-related needs will improve 07/06/2020 1006 by Penelope Galas, RN Outcome: Progressing 07/06/2020 0739 by Penelope Galas, RN Outcome: Progressing   Problem: Clinical Measurements: Goal: Ability to maintain clinical measurements within normal limits will improve 07/06/2020 1006 by Penelope Galas, RN Outcome: Progressing 07/06/2020 0739 by Penelope Galas, RN Outcome: Progressing Goal: Will remain free from infection 07/06/2020 1006 by Penelope Galas, RN Outcome: Progressing 07/06/2020 0739 by Penelope Galas, RN Outcome: Progressing Goal: Diagnostic test results will improve 07/06/2020 1006 by Penelope Galas, RN Outcome: Progressing 07/06/2020 0739 by Penelope Galas, RN Outcome: Progressing Goal: Respiratory complications will improve 07/06/2020 1006 by Penelope Galas, RN Outcome: Progressing 07/06/2020 0739 by Penelope Galas, RN Outcome: Progressing Goal: Cardiovascular complication will be avoided 07/06/2020 1006 by Penelope Galas, RN Outcome: Progressing 07/06/2020 0739 by Penelope Galas, RN Outcome: Progressing   Problem: Activity: Goal: Risk for activity intolerance will decrease 07/06/2020 1006 by Penelope Galas, RN Outcome: Progressing 07/06/2020 0739 by Penelope Galas, RN Outcome: Progressing   Problem: Nutrition: Goal:  Adequate nutrition will be maintained 07/06/2020 1006 by Penelope Galas, RN Outcome: Progressing 07/06/2020 0739 by Penelope Galas, RN Outcome: Progressing   Problem: Coping: Goal: Level of anxiety will decrease 07/06/2020 1006 by Penelope Galas, RN Outcome: Progressing 07/06/2020 0739 by Penelope Galas, RN Outcome: Progressing   Problem: Elimination: Goal: Will not experience complications related to bowel motility 07/06/2020 1006 by Penelope Galas, RN Outcome: Progressing 07/06/2020 0739 by Penelope Galas, RN Outcome: Progressing Goal: Will not experience complications related to urinary retention 07/06/2020 1006 by Penelope Galas, RN Outcome: Progressing 07/06/2020 0739 by Penelope Galas, RN Outcome: Progressing   Problem: Pain Managment: Goal: General experience of comfort will improve 07/06/2020 1006 by Penelope Galas, RN Outcome: Progressing 07/06/2020 0739 by Penelope Galas, RN Outcome: Progressing   Problem: Safety: Goal: Ability to remain free from injury will improve 07/06/2020 1006 by Penelope Galas, RN Outcome: Progressing 07/06/2020 0739 by Penelope Galas, RN Outcome: Progressing   Problem: Skin Integrity: Goal: Risk for impaired skin integrity will decrease 07/06/2020 1006 by Penelope Galas, RN Outcome: Progressing 07/06/2020 0739 by Penelope Galas, RN Outcome: Progressing
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
## 2020-07-07 ENCOUNTER — Inpatient Hospital Stay (HOSPITAL_COMMUNITY)
Admission: RE | Admit: 2020-07-07 | Discharge: 2020-07-22 | DRG: 560 | Disposition: A | Discharge status: Home / Self Care | Payer: Medicaid Other | Source: Intra-hospital | Attending: Physical Medicine & Rehabilitation | Admitting: Physical Medicine & Rehabilitation

## 2020-07-07 ENCOUNTER — Other Ambulatory Visit: Payer: Self-pay | Admitting: Physical Medicine and Rehabilitation

## 2020-07-07 ENCOUNTER — Encounter (HOSPITAL_COMMUNITY): Payer: Self-pay

## 2020-07-07 ENCOUNTER — Encounter (HOSPITAL_COMMUNITY): Payer: Self-pay | Admitting: Physical Medicine & Rehabilitation

## 2020-07-07 DIAGNOSIS — G4700 Insomnia, unspecified: Secondary | ICD-10-CM | POA: Diagnosis present

## 2020-07-07 DIAGNOSIS — E8809 Other disorders of plasma-protein metabolism, not elsewhere classified: Secondary | ICD-10-CM | POA: Diagnosis present

## 2020-07-07 DIAGNOSIS — T07XXXA Unspecified multiple injuries, initial encounter: Secondary | ICD-10-CM

## 2020-07-07 DIAGNOSIS — R0989 Other specified symptoms and signs involving the circulatory and respiratory systems: Secondary | ICD-10-CM

## 2020-07-07 DIAGNOSIS — G479 Sleep disorder, unspecified: Secondary | ICD-10-CM

## 2020-07-07 DIAGNOSIS — G8918 Other acute postprocedural pain: Secondary | ICD-10-CM | POA: Diagnosis not present

## 2020-07-07 DIAGNOSIS — I952 Hypotension due to drugs: Secondary | ICD-10-CM | POA: Diagnosis not present

## 2020-07-07 DIAGNOSIS — M62838 Other muscle spasm: Secondary | ICD-10-CM | POA: Diagnosis not present

## 2020-07-07 DIAGNOSIS — Z87891 Personal history of nicotine dependence: Secondary | ICD-10-CM | POA: Diagnosis not present

## 2020-07-07 DIAGNOSIS — S82872D Displaced pilon fracture of left tibia, subsequent encounter for closed fracture with routine healing: Secondary | ICD-10-CM

## 2020-07-07 DIAGNOSIS — Z79899 Other long term (current) drug therapy: Secondary | ICD-10-CM

## 2020-07-07 DIAGNOSIS — K5903 Drug induced constipation: Secondary | ICD-10-CM | POA: Diagnosis not present

## 2020-07-07 DIAGNOSIS — S82852D Displaced trimalleolar fracture of left lower leg, subsequent encounter for closed fracture with routine healing: Secondary | ICD-10-CM | POA: Diagnosis not present

## 2020-07-07 DIAGNOSIS — D62 Acute posthemorrhagic anemia: Secondary | ICD-10-CM | POA: Diagnosis present

## 2020-07-07 DIAGNOSIS — E559 Vitamin D deficiency, unspecified: Secondary | ICD-10-CM | POA: Diagnosis present

## 2020-07-07 DIAGNOSIS — S82001D Unspecified fracture of right patella, subsequent encounter for closed fracture with routine healing: Secondary | ICD-10-CM

## 2020-07-07 DIAGNOSIS — S72391D Other fracture of shaft of right femur, subsequent encounter for closed fracture with routine healing: Secondary | ICD-10-CM | POA: Diagnosis present

## 2020-07-07 DIAGNOSIS — F419 Anxiety disorder, unspecified: Secondary | ICD-10-CM | POA: Diagnosis present

## 2020-07-07 DIAGNOSIS — T148XXA Other injury of unspecified body region, initial encounter: Secondary | ICD-10-CM

## 2020-07-07 DIAGNOSIS — S2239XD Fracture of one rib, unspecified side, subsequent encounter for fracture with routine healing: Secondary | ICD-10-CM | POA: Diagnosis not present

## 2020-07-07 DIAGNOSIS — S82002D Unspecified fracture of left patella, subsequent encounter for closed fracture with routine healing: Secondary | ICD-10-CM

## 2020-07-07 DIAGNOSIS — I959 Hypotension, unspecified: Secondary | ICD-10-CM

## 2020-07-07 DIAGNOSIS — E46 Unspecified protein-calorie malnutrition: Secondary | ICD-10-CM

## 2020-07-07 MED ORDER — TRAZODONE HCL 50 MG PO TABS: 25.0000 mg | ORAL_TABLET | Freq: Every evening | ORAL | Status: DC | PRN

## 2020-07-07 MED ORDER — LUBRIDERM SERIOUSLY SENSITIVE EX LOTN
TOPICAL_LOTION | Freq: Every day | CUTANEOUS | Status: DC
  Filled 2020-07-07: qty 473

## 2020-07-07 MED ORDER — PROCHLORPERAZINE EDISYLATE 10 MG/2ML IJ SOLN: 5.0000 mg | Freq: Four times a day (QID) | INTRAMUSCULAR | Status: DC | PRN

## 2020-07-07 MED ORDER — BUSPIRONE HCL 5 MG PO TABS: 5.0000 mg | ORAL_TABLET | Freq: Three times a day (TID) | ORAL | Status: DC | PRN

## 2020-07-07 MED ORDER — ACETAMINOPHEN 325 MG PO TABS
650.0000 mg | ORAL_TABLET | Freq: Three times a day (TID) | ORAL | Status: DC
  Administered 2020-07-07 – 2020-07-18 (×41): 650 mg via ORAL
  Filled 2020-07-07 (×42): qty 2

## 2020-07-07 MED ORDER — ALUM & MAG HYDROXIDE-SIMETH 200-200-20 MG/5ML PO SUSP: 30.0000 mL | ORAL | Status: DC | PRN

## 2020-07-07 MED ORDER — GABAPENTIN 300 MG PO CAPS
600.0000 mg | ORAL_CAPSULE | Freq: Three times a day (TID) | ORAL | Status: DC
  Administered 2020-07-07 – 2020-07-22 (×44): 600 mg via ORAL
  Filled 2020-07-07 (×44): qty 2

## 2020-07-07 MED ORDER — POLYETHYLENE GLYCOL 3350 17 G PO PACK: 17.0000 g | PACK | Freq: Every day | ORAL | Status: DC | PRN

## 2020-07-07 MED ORDER — ACETAMINOPHEN 325 MG PO TABS
325.0000 mg | ORAL_TABLET | ORAL | Status: DC | PRN
  Administered 2020-07-11: 650 mg via ORAL
  Filled 2020-07-07 (×2): qty 2

## 2020-07-07 MED ORDER — TRAMADOL HCL 50 MG PO TABS
100.0000 mg | ORAL_TABLET | Freq: Four times a day (QID) | ORAL | Status: DC
  Administered 2020-07-07 – 2020-07-18 (×43): 100 mg via ORAL
  Filled 2020-07-07 (×44): qty 2

## 2020-07-07 MED ORDER — PROCHLORPERAZINE MALEATE 5 MG PO TABS: 5.0000 mg | ORAL_TABLET | Freq: Four times a day (QID) | ORAL | Status: DC | PRN

## 2020-07-07 MED ORDER — DIPHENHYDRAMINE HCL 12.5 MG/5ML PO ELIX: 12.5000 mg | ORAL_SOLUTION | Freq: Four times a day (QID) | ORAL | Status: DC | PRN

## 2020-07-07 MED ORDER — DIPHENHYDRAMINE HCL 25 MG PO CAPS: 25.0000 mg | ORAL_CAPSULE | Freq: Four times a day (QID) | ORAL | Status: DC | PRN

## 2020-07-07 MED ORDER — GUAIFENESIN-DM 100-10 MG/5ML PO SYRP: 5.0000 mL | ORAL_SOLUTION | Freq: Four times a day (QID) | ORAL | Status: DC | PRN

## 2020-07-07 MED ORDER — CHLORHEXIDINE GLUCONATE CLOTH 2 % EX PADS: 6.0000 | MEDICATED_PAD | Freq: Every day | CUTANEOUS | Status: DC

## 2020-07-07 MED ORDER — ENOXAPARIN SODIUM 30 MG/0.3ML ~~LOC~~ SOLN
30.0000 mg | Freq: Two times a day (BID) | SUBCUTANEOUS | Status: DC
  Administered 2020-07-07 – 2020-07-20 (×26): 30 mg via SUBCUTANEOUS
  Filled 2020-07-07 (×26): qty 0.3

## 2020-07-07 MED ORDER — OXYCODONE HCL 5 MG PO TABS
10.0000 mg | ORAL_TABLET | ORAL | Status: DC | PRN
  Administered 2020-07-08: 15 mg via ORAL
  Administered 2020-07-08: 10 mg via ORAL
  Administered 2020-07-08 – 2020-07-10 (×4): 15 mg via ORAL
  Administered 2020-07-11 (×2): 10 mg via ORAL
  Administered 2020-07-11 – 2020-07-13 (×3): 15 mg via ORAL
  Administered 2020-07-14: 10 mg via ORAL
  Administered 2020-07-14: 15 mg via ORAL
  Administered 2020-07-15 – 2020-07-16 (×2): 10 mg via ORAL
  Administered 2020-07-17: 15 mg via ORAL
  Filled 2020-07-07: qty 3
  Filled 2020-07-07 (×2): qty 2
  Filled 2020-07-07 (×4): qty 3
  Filled 2020-07-07: qty 2
  Filled 2020-07-07 (×3): qty 3
  Filled 2020-07-07: qty 2
  Filled 2020-07-07: qty 3
  Filled 2020-07-07: qty 2
  Filled 2020-07-07: qty 3
  Filled 2020-07-07: qty 2
  Filled 2020-07-07: qty 3

## 2020-07-07 MED ORDER — VITAMIN D 25 MCG (1000 UNIT) PO TABS
2000.0000 [IU] | ORAL_TABLET | Freq: Two times a day (BID) | ORAL | Status: DC
  Administered 2020-07-07 – 2020-07-22 (×30): 2000 [IU] via ORAL
  Filled 2020-07-07 (×30): qty 2

## 2020-07-07 MED ORDER — FLEET ENEMA 7-19 GM/118ML RE ENEM: 1.0000 | ENEMA | Freq: Once | RECTAL | Status: DC | PRN

## 2020-07-07 MED ORDER — METHOCARBAMOL 500 MG PO TABS
1000.0000 mg | ORAL_TABLET | Freq: Three times a day (TID) | ORAL | Status: DC
  Administered 2020-07-07 – 2020-07-22 (×44): 1000 mg via ORAL
  Filled 2020-07-07 (×45): qty 2

## 2020-07-07 MED ORDER — DOCUSATE SODIUM 100 MG PO CAPS
100.0000 mg | ORAL_CAPSULE | Freq: Two times a day (BID) | ORAL | Status: DC
  Administered 2020-07-07 – 2020-07-22 (×30): 100 mg via ORAL
  Filled 2020-07-07 (×31): qty 1

## 2020-07-07 MED ORDER — BISACODYL 10 MG RE SUPP: 10.0000 mg | Freq: Every day | RECTAL | Status: DC | PRN

## 2020-07-07 MED ORDER — VITAMIN D (ERGOCALCIFEROL) 1.25 MG (50000 UNIT) PO CAPS
50000.0000 [IU] | ORAL_CAPSULE | ORAL | Status: DC
  Administered 2020-07-08 – 2020-07-15 (×2): 50000 [IU] via ORAL
  Filled 2020-07-07 (×2): qty 1

## 2020-07-07 MED ORDER — ENOXAPARIN SODIUM 40 MG/0.4ML ~~LOC~~ SOLN: 40.0000 mg | SUBCUTANEOUS | Status: DC

## 2020-07-07 MED ORDER — PROCHLORPERAZINE 25 MG RE SUPP: 12.5000 mg | Freq: Four times a day (QID) | RECTAL | Status: DC | PRN
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
## 2020-07-07 NOTE — Progress Notes (Signed)
4W RN & NT transferred pt to their unit;pt in stable condition.
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
## 2020-07-07 NOTE — Progress Notes (Signed)
Inpatient Rehab Admissions Coordinator:  Met with pt at bedside. Pt continues to be interested in CIR.  There is a bed available to admit pt to CIR today.  PA, [employer] in agreement. NSG also made aware.    Gayland Curry, [place], [place] Admissions Coordinator [phone]
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
## 2020-07-07 NOTE — TOC Transition Note (Signed)
Transition of Care [facility]) - CM/SW Discharge Note   Patient Details  Name: Megan Bailey MRN: [id number] Date of Birth: [birth year]  [facility] CM/SW Contact:  Glennon Mac, RN Phone Number: 07/07/2020, 3:49 PM   Clinical Narrative:   Pt medically stable for discharge today and has been accepted for admission to [facility] IP Rehab today.  Plan dc to rehab unit upon bed availability.      Final next level of care: IP Rehab Facility Barriers to Discharge: Barriers Resolved   Patient Goals and CMS Choice Patient states their goals for this hospitalization and ongoing recovery are:: to return home CMS Medicare.gov Compare Post Acute Care list provided to:: Patient Choice offered to / list presented to : Patient                      Discharge Plan and Services In-house Referral: Clinical Social Work Discharge Planning Services: CM Consult Post Acute Care Choice: IP Rehab                               Social Determinants of Health (SDOH) Interventions     Readmission Risk Interventions No flowsheet data found.  Quintella Baton, RN, BSN  Trauma/Neuro ICU Case Manager [phone]

## 2020-07-07 NOTE — Progress Notes (Signed)
Inpatient Rehabilitation Medication Review by a Pharmacist  A complete drug regimen review was completed for this patient to identify any potential clinically significant medication issues.  Clinically significant medication issues were identified:  no  Check AMION for pharmacist assigned to patient if future medication questions/issues arise during this admission.  Pharmacist comments: can resume birth control pill upon discharge or will have to supply from home if will use in CIR  Time spent performing this drug regimen review (minutes):  5   Thank you for involving pharmacy in this patient's care.  Loura Back, PharmD, BCPS Clinical Pharmacist Clinical phone for 07/07/2020 until 9:30p is [phone] 07/07/2020 5:36 PM  **Pharmacist phone directory can be found on [email or web address] listed under [facility]**
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
## 2020-07-08 ENCOUNTER — Inpatient Hospital Stay (HOSPITAL_COMMUNITY): Payer: Medicaid Other | Admitting: Physical Therapy

## 2020-07-08 ENCOUNTER — Other Ambulatory Visit: Payer: Self-pay

## 2020-07-08 ENCOUNTER — Inpatient Hospital Stay (HOSPITAL_COMMUNITY): Payer: Medicaid Other

## 2020-07-08 DIAGNOSIS — T07XXXA Unspecified multiple injuries, initial encounter: Secondary | ICD-10-CM

## 2020-07-08 DIAGNOSIS — F411 Generalized anxiety disorder: Secondary | ICD-10-CM

## 2020-07-08 DIAGNOSIS — D62 Acute posthemorrhagic anemia: Secondary | ICD-10-CM

## 2020-07-08 DIAGNOSIS — S82009S Unspecified fracture of unspecified patella, sequela: Secondary | ICD-10-CM

## 2020-07-08 MED ORDER — TRAZODONE HCL 50 MG PO TABS: 50.0000 mg | ORAL_TABLET | Freq: Every evening | ORAL | Status: DC | PRN

## 2020-07-08 MED ORDER — ALPRAZOLAM 0.25 MG PO TABS: 0.2500 mg | ORAL_TABLET | Freq: Three times a day (TID) | ORAL | Status: DC | PRN
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
## 2020-07-08 NOTE — Progress Notes (Signed)
Occupational Therapy Session Note  Patient Details  Name: Megan Bailey MRN: [id number] Date of Birth: 1980/11/17  Today's Date: 07/08/2020 OT Individual Time: 1330-1413 OT Individual Time Calculation (min): 43 min    Short Term Goals: Week 1:  OT Short Term Goal 1 (Week 1): Pt will transfer to [facility] with MIN A and LRAD OT Short Term Goal 2 (Week 1): Pt will thread BLE into pants with AE PRN OT Short Term Goal 3 (Week 1): Pt will STS wiht LRAD and good adherence ot precautions with MIN A OT Short Term Goal 4 (Week 1): Pt will bathe with supervision and VC only (AE PRN)  Skilled Therapeutic Interventions/Progress Updates:  1:1. Pt received in bed agreeable to OT with 5/10 pain. Repositioning provided at end of session. Pt completes LB/UB dressing at bed level with set up for both. Pt uses lateral leans to advance pants past hips. Pt edu re toileting strategies. Pt completes SB to [facility] with MIN A for board positioning and demo w/c push up on arm rests and RLE with LLE elevated to remain off floor while second person is able to manage pants past hips. Pt reports this is much easier than SPT and OT educates on adherence to WB precautions. Exited session with pt seated in bed exit alarm on and call light in reach  Therapy Documentation Precautions:  Precautions Precautions: Fall Precaution Comments: anxious Required Braces or Orthoses: Other Brace Knee Immobilizer - Left: On at all times Other Brace: hinge knee brace, locked into extension at all times Restrictions Weight Bearing Restrictions: Yes RLE Weight Bearing: Weight bearing as tolerated LLE Weight Bearing: Non weight bearing General: General Family/Caregiver Present: No Vital Signs:   Pain: Pain Assessment Pain Score: 7  Pain Type: Surgical pain ADL: ADL ADL Comments: patient refused bathing as she did it before Vision Wears Glasses: Distance only Patient Visual Report: No change from baseline Vision Assessment?: No  apparent visual deficits Perception  Perception: Within Functional Limits Praxis Praxis: Intact Exercises:   Other Treatments:     Therapy/Group: Individual Therapy  Shon Hale 07/08/2020, 1:31 PM

## 2020-07-08 NOTE — Plan of Care (Signed)
  Problem: Consults Goal: RH GENERAL PATIENT EDUCATION Description: See Patient Education module for education specifics. Outcome: Progressing   Problem: RH SKIN INTEGRITY Goal: RH STG SKIN FREE OF INFECTION/BREAKDOWN Description: With min assist Outcome: Progressing Goal: RH STG MAINTAIN SKIN INTEGRITY WITH ASSISTANCE Description: STG Maintain Skin Integrity With min Assistance. Outcome: Progressing Goal: RH STG ABLE TO PERFORM INCISION/WOUND CARE W/ASSISTANCE Description: STG Able To Perform Incision/Wound Care With min Assistance. Outcome: Progressing   Problem: RH SAFETY Goal: RH STG ADHERE TO SAFETY PRECAUTIONS W/ASSISTANCE/DEVICE Description: STG Adhere to Safety Precautions With cues/reminders Assistance/Device. Outcome: Progressing   Problem: RH PAIN MANAGEMENT Goal: RH STG PAIN MANAGED AT OR BELOW PT'S PAIN GOAL Description: At or below level 4 Outcome: Progressing   Problem: RH KNOWLEDGE DEFICIT GENERAL Goal: RH STG INCREASE KNOWLEDGE OF SELF CARE AFTER HOSPITALIZATION Description: Patient will be able to manage self care at discharge using handouts and educational information independently Outcome: Progressing   

## 2020-07-08 NOTE — Plan of Care (Signed)
  Problem: RH Balance Goal: LTG Patient will maintain dynamic standing balance (PT) Description: LTG:  Patient will maintain dynamic standing balance with assistance during mobility activities (PT) Flowsheets (Taken 07/08/2020 0921) LTG: Pt will maintain dynamic standing balance during mobility activities with:: Independent with assistive device    Problem: Sit to Stand Goal: LTG:  Patient will perform sit to stand with assistance level (PT) Description: LTG:  Patient will perform sit to stand with assistance level (PT) Flowsheets (Taken 07/08/2020 0921) LTG: PT will perform sit to stand in preparation for functional mobility with assistance level: Independent with assistive device   Problem: RH Bed Mobility Goal: LTG Patient will perform bed mobility with assist (PT) Description: LTG: Patient will perform bed mobility with assistance, with/without cues (PT). Flowsheets (Taken 07/08/2020 0921) LTG: Pt will perform bed mobility with assistance level of: Independent with assistive device    Problem: RH Bed to Chair Transfers Goal: LTG Patient will perform bed/chair transfers w/assist (PT) Description: LTG: Patient will perform bed to chair transfers with assistance (PT). Flowsheets (Taken 07/08/2020 0921) LTG: Pt will perform Bed to Chair Transfers with assistance level: Independent with assistive device    Problem: RH Car Transfers Goal: LTG Patient will perform car transfers with assist (PT) Description: LTG: Patient will perform car transfers with assistance (PT). Flowsheets (Taken 07/08/2020 0921) LTG: Pt will perform car transfers with assist:: Minimal Assistance - Patient > 75%   Problem: RH Furniture Transfers Goal: LTG Patient will perform furniture transfers w/assist (OT/PT) Description: LTG: Patient will perform furniture transfers  with assistance (OT/PT). Flowsheets (Taken 07/08/2020 0921) LTG: Pt will perform furniture transfers with assist:: Independent with assistive device     Problem: RH Ambulation Goal: LTG Patient will ambulate in controlled environment (PT) Description: LTG: Patient will ambulate in a controlled environment, # of feet with assistance (PT). Flowsheets (Taken 07/08/2020 0921) LTG: Pt will ambulate in controlled environ  assist needed:: Independent with assistive device LTG: Ambulation distance in controlled environment: 75ft with LRAD Goal: LTG Patient will ambulate in home environment (PT) Description: LTG: Patient will ambulate in home environment, # of feet with assistance (PT). Flowsheets (Taken 07/08/2020 0921) LTG: Pt will ambulate in home environ  assist needed:: Independent with assistive device LTG: Ambulation distance in home environment: 58ft with LRAD   Problem: RH Wheelchair Mobility Goal: LTG Patient will propel w/c in controlled environment (PT) Description: LTG: Patient will propel wheelchair in controlled environment, # of feet with assist (PT) Flowsheets (Taken 07/08/2020 0921) LTG: Pt will propel w/c in controlled environ  assist needed:: Independent with assistive device LTG: Propel w/c distance in controlled environment: 191ft Goal: LTG Patient will propel w/c in home environment (PT) Description: LTG: Patient will propel wheelchair in home environment, # of feet with assistance (PT). Flowsheets (Taken 07/08/2020 0921) LTG: Pt will propel w/c in home environ  assist needed:: Independent with assistive device Distance: wheelchair distance in controlled environment: 50   Problem: RH Stairs Goal: LTG Patient will ambulate up and down stairs w/assist (PT) Description: LTG: Patient will ambulate up and down # of stairs with assistance (PT) Flowsheets (Taken 07/08/2020 0922) LTG: Pt will ambulate up/down stairs assist needed:: Minimal Assistance - Patient > 75% LTG: Pt will  ambulate up and down number of stairs: 4 step while maintaining NWB in the LLE
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
## 2020-07-08 NOTE — Progress Notes (Signed)
Physical Therapy Session Note  Patient Details  Name: Megan Bailey MRN: [id number] Date of Birth: 25-Sep-1981  Today's Date: 07/08/2020 PT Individual Time: 6333-5456 PT Individual Time Calculation (min): 25 min   Short Term Goals: Week 1:  PT Short Term Goal 1 (Week 1): Pt will initiate gait training with RW while maintaining NWB PT Short Term Goal 2 (Week 1): Pt will transfer to and from [facility] with min assist using RW. PT Short Term Goal 3 (Week 1): Pt will perform sit<>stand from [facility] with min assist PT Short Term Goal 4 (Week 1): Pt will propell WC through rehab unit without assist x 163f  Skilled Therapeutic Interventions/Progress Updates:  Pt received supine in bed and agreeable to PT. Supine>sit transfer with supervision assist and increased time. Pt utilizing bledsoe brace to control the LLE. Sitting balance x 5 minutes with supervision assist with min cues for LE positioning and NWB through the LLE. Sit<>stand from EOB from elevated height with min assist and increased time due to R LE pain. Pt noted to standing x 30sec with LLE in TDWB throughout time in stance. Sit>supine completed with supervision assist for safety, and left supine in bed with call bell in reach and all needs met.     Therapy Documentation Precautions:  Precautions Precautions: Fall Precaution Comments: anxious Required Braces or Orthoses: Other Brace Knee Immobilizer - Left: On at all times Other Brace: hinge knee brace, locked into extension at all times Restrictions Weight Bearing Restrictions: Yes RLE Weight Bearing: Weight bearing as tolerated LLE Weight Bearing: Non weight bearing Pain: Pain Assessment Pain Scale: 0-10 Pain Score: 7  Pain Intervention(s): Medication (See eMAR)    Therapy/Group: Individual Therapy  ALorie Phenix9/25/2021, 5:45 PM
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
## 2020-07-09 NOTE — Plan of Care (Signed)
  Problem: Consults Goal: RH GENERAL PATIENT EDUCATION Description: See Patient Education module for education specifics. Outcome: Progressing   Problem: RH SKIN INTEGRITY Goal: RH STG SKIN FREE OF INFECTION/BREAKDOWN Description: With min assist Outcome: Progressing Goal: RH STG MAINTAIN SKIN INTEGRITY WITH ASSISTANCE Description: STG Maintain Skin Integrity With min Assistance. Outcome: Progressing Goal: RH STG ABLE TO PERFORM INCISION/WOUND CARE W/ASSISTANCE Description: STG Able To Perform Incision/Wound Care With min Assistance. Outcome: Progressing   Problem: RH SAFETY Goal: RH STG ADHERE TO SAFETY PRECAUTIONS W/ASSISTANCE/DEVICE Description: STG Adhere to Safety Precautions With cues/reminders Assistance/Device. Outcome: Progressing   Problem: RH PAIN MANAGEMENT Goal: RH STG PAIN MANAGED AT OR BELOW PT'S PAIN GOAL Description: At or below level 4 Outcome: Progressing   Problem: RH KNOWLEDGE DEFICIT GENERAL Goal: RH STG INCREASE KNOWLEDGE OF SELF CARE AFTER HOSPITALIZATION Description: Patient will be able to manage self care at discharge using handouts and educational information independently Outcome: Progressing

## 2020-07-09 NOTE — Progress Notes (Signed)
Offered patient opportunity to give herself lovenox shot in case she goes home on medication but she declined. Discussed technique.

## 2020-07-10 ENCOUNTER — Inpatient Hospital Stay (HOSPITAL_COMMUNITY): Payer: Medicaid Other | Admitting: Occupational Therapy

## 2020-07-10 ENCOUNTER — Inpatient Hospital Stay (HOSPITAL_COMMUNITY): Payer: Medicaid Other

## 2020-07-10 LAB — CBC WITH DIFFERENTIAL/PLATELET
Abs Immature Granulocytes: 0.02 10*3/uL (ref 0.00–0.07)
Basophils Absolute: 0 10*3/uL (ref 0.0–0.1)
Basophils Relative: 1 %
Eosinophils Absolute: 0.2 10*3/uL (ref 0.0–0.5)
Eosinophils Relative: 3 %
HCT: 32.8 % — ABNORMAL LOW (ref 36.0–46.0)
Hemoglobin: 10.2 g/dL — ABNORMAL LOW (ref 12.0–15.0)
Immature Granulocytes: 0 %
Lymphocytes Relative: 34 %
Lymphs Abs: 1.9 10*3/uL (ref 0.7–4.0)
MCH: 28.7 pg (ref 26.0–34.0)
MCHC: 31.1 g/dL (ref 30.0–36.0)
MCV: 92.1 fL (ref 80.0–100.0)
Monocytes Absolute: 0.6 10*3/uL (ref 0.1–1.0)
Monocytes Relative: 12 %
Neutro Abs: 2.7 10*3/uL (ref 1.7–7.7)
Neutrophils Relative %: 50 %
Platelets: 513 10*3/uL — ABNORMAL HIGH (ref 150–400)
RBC: 3.56 MIL/uL — ABNORMAL LOW (ref 3.87–5.11)
RDW: 14 % (ref 11.5–15.5)
WBC: 5.5 10*3/uL (ref 4.0–10.5)
nRBC: 0 % (ref 0.0–0.2)

## 2020-07-10 LAB — COMPREHENSIVE METABOLIC PANEL
ALT: 22 U/L (ref 0–44)
AST: 15 U/L (ref 15–41)
Albumin: 3 g/dL — ABNORMAL LOW (ref 3.5–5.0)
Alkaline Phosphatase: 94 U/L (ref 38–126)
Anion gap: 5 (ref 5–15)
BUN: 12 mg/dL (ref 6–20)
CO2: 27 mmol/L (ref 22–32)
Calcium: 8.9 mg/dL (ref 8.9–10.3)
Chloride: 104 mmol/L (ref 98–111)
Creatinine, Ser: 0.57 mg/dL (ref 0.44–1.00)
GFR calc Af Amer: >60 mL/min (ref >60)
GFR calc non Af Amer: >60 mL/min (ref >60)
Glucose, Bld: 85 mg/dL (ref 70–99)
Potassium: 3.7 mmol/L (ref 3.5–5.1)
Sodium: 136 mmol/L (ref 135–145)
Total Bilirubin: 0.7 mg/dL (ref 0.3–1.2)
Total Protein: 6 g/dL — ABNORMAL LOW (ref 6.5–8.1)

## 2020-07-10 NOTE — Progress Notes (Signed)
Inpatient Rehabilitation  Patient information reviewed and entered into eRehab system by Lynnelle Mesmer M. Lennyn Bellanca, M.A., CCC/SLP, PPS Coordinator.  Information including medical coding, functional ability and quality indicators will be reviewed and updated through discharge.    

## 2020-07-10 NOTE — Progress Notes (Signed)
Inpatient Rehabilitation Center Individual Statement of Services  Patient Name:  Megan Bailey  Date:  07/10/2020  Welcome to the Inpatient Rehabilitation Center.  Our goal is to provide you with an individualized program based on your diagnosis and situation, designed to meet your specific needs.  With this comprehensive rehabilitation program, you will be expected to participate in at least 3 hours of rehabilitation therapies Monday-Friday, with modified therapy programming on the weekends.  Your rehabilitation program will include the following services:  Physical Therapy (PT), Occupational Therapy (OT), 24 hour per day rehabilitation nursing, Therapeutic Recreaction (TR), Neuropsychology, Care Coordinator, Rehabilitation Medicine, Nutrition Services and Pharmacy Services  Weekly team conferences will be held on Wednesday to discuss your progress.  Your Inpatient Rehabilitation Care Coordinator will talk with you frequently to get your input and to update you on team discussions.  Team conferences with you and your family in attendance may also be held.  Expected length of stay: 10-14 days  Overall anticipated outcome: independent with device 15 ft  Depending on your progress and recovery, your program may change. Your Inpatient Rehabilitation Care Coordinator will coordinate services and will keep you informed of any changes. Your Inpatient Rehabilitation Care Coordinator's name and contact numbers are listed  below.  The following services may also be recommended but are not provided by the Inpatient Rehabilitation Center:   Driving Evaluations  Home Health Rehabiltiation Services  Outpatient Rehabilitation Services  Vocational Rehabilitation   Arrangements will be made to provide these services after discharge if needed.  Arrangements include referral to agencies that provide these services.  Your insurance has been verified to be:  Med-pay Your primary doctor is:  None recent move  from [place]  Pertinent information will be shared with your doctor and your insurance company.  Inpatient Rehabilitation Care Coordinator:  Dossie Der, [place] [phone] or Luna [place]  Information discussed with and copy given to patient by: Lucy Chris, 07/10/2020, 10:46 AM
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
## 2020-07-10 NOTE — Progress Notes (Signed)
Physical Therapy Session Note  Patient Details  Name: Megan Bailey MRN: [id number] Date of Birth: 03/22/1981  Today's Date: 07/10/2020 PT Individual Time: 1660-6301 and 6010-9323 PT Individual Time Calculation (min): 57 min and 70 min  Short Term Goals: Week 1:  PT Short Term Goal 1 (Week 1): Pt will initiate gait training with RW while maintaining NWB PT Short Term Goal 2 (Week 1): Pt will transfer to and from [facility] with min assist using RW. PT Short Term Goal 3 (Week 1): Pt will perform sit<>stand from [facility] with min assist PT Short Term Goal 4 (Week 1): Pt will propell WC through rehab unit without assist x 144ft  Skilled Therapeutic Interventions/Progress Updates:   Treatment Session 1: 0800-0957 57 min Received pt supine in bed, pt agreeable to therapy, and reported pain 7/10 in bilateral knees and L ankle and along anterior rib cage (premedicated). Repositioning, rest breaks, and distraction done to reduce pain levels  Session with emphasis on dressing, functional mobility/transfers, generalized strengthening, dynamic sitting balance/coordination, and improved activity tolerance. L bledsoe brace donned during session and locked into extension. Donned pants in semi-fowlers position with supervision and increased time. Pt reported increased pain and tightness along R quad and able to actively flex R knee to 90 degrees. Therapist encouraged pt to perform R heel slides to increase knee ROM and pt reported she began performing that exercise yesterday and noticed improved ROM. MD present for morning rounds and to discuss pins and needles sensation along bilateral great toes. Pt applied lotion to bilateral feet with supervision. Pt required increased time with all mobility due to pain and tendency to get distracted talking. Pt reported symptoms of dizziness with head turns and this therapist recommended vestibular consult; OT made aware. Pt donned R shoe sitting EOB with mod A and pt transferred bed<>WC  via slideboard with CGA/min A while maintaining L LE NWB precautions. Pt brushed teeth sitting in WC at sink with supervision and RN present to administer medications. Concluded session with pt sitting in WC, needs within reach, and chair pad alarm on.  Treatment Session 2: 1415-1525 70 min Received pt supine in bed, pt agreeable to therapy, and denied any pain at rest and reported soreness in buttocks from sitting in bed. Session with emphasis on functional mobility/transfers, generalized strengthening, dynamic standing balance/coordination, and improved activity tolerance. RN present to administer medications and pt reported urge to urinate. Therapist suggested using bedside commode, however pt politely declined stating "it's going to hurt" and requested to use female urinal for time purposes. Pt able to doff clothing and void in urinal independently. Therapist educated pt on importance of movement despite pain for recovery, healing, strengthening, mobility, and increased independence upon D/C; pt verbalized understanding. Pt transferred supine<>sitting EOB with HOB elevated and use of bedrails with supervision and transferred bed<>WC via slideboard CGA and performed WC mobility 42ft using bilateral UEs and supervision to dayroom. Pt transferred sit<>stand with RW min A and worked on dynamic standing balance ~10 minutes playing cornhole x 3 trials with RW while demonstrating TDWB on L LE. Pt aware of NWB precautions but reported increased difficulty holding L LE from ground due to L bledsoe brace being locked into extension. Discussed home setup including bed height, doorways and entrances, and thresholds. Pt reported a WC probably wouldn't fit through her home and she would probably need to use the RW. Pt also reported she has 3 steps with 2 rails to enter her home. Discussed strategies to navigate stairs with  plans to practice in future sessions. Pt performed R LE strengthening on Kinetron at 20cm/sec for 1  minute x 4 trials with therapist providing coutner resistance for improved R LE strengthening and ROM. Pt transported back to room in [facility] total A and transferred WC<>bed via lateral scoot with supervision. Concluded session with pt sitting in bed, needs within reach, and bed alarm on.   Therapy Documentation Precautions:  Precautions Precautions: Fall Precaution Comments: anxious Required Braces or Orthoses: Other Brace Knee Immobilizer - Left: On at all times Other Brace: hinge knee brace, locked into extension at all times Restrictions Weight Bearing Restrictions: Yes RLE Weight Bearing: Weight bearing as tolerated LLE Weight Bearing: Non weight bearing  Therapy/Group: Individual Therapy Martin Majestic PT, DPT   07/10/2020, 7:21 AM
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
## 2020-07-10 NOTE — Progress Notes (Signed)
Occupational Therapy Session Note  Patient Details  Name: Megan Bailey MRN: [id number] Date of Birth: 1981-04-26  Today's Date: 07/10/2020 OT Individual Time: 9038-3338 OT Individual Time Calculation (min): 46 min    Short Term Goals: Week 1:  OT Short Term Goal 1 (Week 1): Pt will transfer to [facility] with MIN A and LRAD OT Short Term Goal 2 (Week 1): Pt will thread BLE into pants with AE PRN OT Short Term Goal 3 (Week 1): Pt will STS wiht LRAD and good adherence ot precautions with MIN A OT Short Term Goal 4 (Week 1): Pt will bathe with supervision and VC only (AE PRN)  Skilled Therapeutic Interventions/Progress Updates:    Treatment session with focus on functional transfers, BUE strengthening, and sit > stand.  Pt received upright at sink fixing her hair.  Pt very verbose throughout session, requiring redirection to engage in therapeutic tasks.  Pt propelled w/c with supervision, cues due to tendency to veer to the Lt with w/c mobility.  Completed slide board transfer with setup assist to remove leg rests and place slide board.  Therapist educating pt on w/c parts management and proper positioning for slide board.  Pt reports that she doesn't intend to need a w/c when she leaves.  Completed slide board transfer to therapy mat with CGA.  Engaged in BUE strengthening with seated pushups from yoga blocks with focus on strength and endurance as well as increased clearance of buttocks as needed for transfers and sit > stand.  Engaged in 2 sets of 15 chest and overhead presses with 8# dowel rod.  Engaged in sit > stand from elevated mat with min assist with focus on appropriate hand placement and sequencing, however pt insistent on pushing up from RW.  Sit > stand x2 with min assist from elevated mat.  Pt reports pain in thigh when standing and decreased ability to straighten Rt knee.  Returned to w/c via slide board as above and remained upright in w/c in room with chair alarm on and all needs in  reach.  Therapy Documentation Precautions:  Precautions Precautions: Fall Precaution Comments: anxious Required Braces or Orthoses: Other Brace Knee Immobilizer - Left: On at all times Other Brace: hinge knee brace, locked into extension at all times Restrictions Weight Bearing Restrictions: Yes RLE Weight Bearing: Weight bearing as tolerated LLE Weight Bearing: Non weight bearing General: General OT Amount of Missed Time: 14 Minutes (therapist late from previous appointment) Pain:  Pt with c/o pain in Rt thigh with mobility and sit > stand.  Repositioned and pain subsided.   Therapy/Group: Individual Therapy  Rosalio Loud 07/10/2020, 12:19 PM

## 2020-07-11 ENCOUNTER — Inpatient Hospital Stay (HOSPITAL_COMMUNITY): Payer: Medicaid Other | Admitting: Occupational Therapy

## 2020-07-11 ENCOUNTER — Inpatient Hospital Stay (HOSPITAL_COMMUNITY): Payer: Medicaid Other | Admitting: Physical Therapy

## 2020-07-11 ENCOUNTER — Inpatient Hospital Stay (HOSPITAL_COMMUNITY): Payer: Medicaid Other

## 2020-07-11 DIAGNOSIS — E559 Vitamin D deficiency, unspecified: Secondary | ICD-10-CM

## 2020-07-11 DIAGNOSIS — D62 Acute posthemorrhagic anemia: Secondary | ICD-10-CM

## 2020-07-11 DIAGNOSIS — E8809 Other disorders of plasma-protein metabolism, not elsewhere classified: Secondary | ICD-10-CM

## 2020-07-11 DIAGNOSIS — G8918 Other acute postprocedural pain: Secondary | ICD-10-CM

## 2020-07-11 DIAGNOSIS — E46 Unspecified protein-calorie malnutrition: Secondary | ICD-10-CM

## 2020-07-11 MED ORDER — PROSOURCE PLUS PO LIQD
30.0000 mL | Freq: Two times a day (BID) | ORAL | Status: DC
  Administered 2020-07-11: 30 mL via ORAL
  Filled 2020-07-11 (×9): qty 30
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
## 2020-07-11 NOTE — Progress Notes (Signed)
Physical Therapy Session Note  Patient Details  Name: Megan Bailey MRN: [id number] Date of Birth: [birth year]  Today's Date: 07/11/2020 PT Individual Time: 1345-1457 PT Individual Time Calculation (min): 72 min   Short Term Goals: Week 1:  PT Short Term Goal 1 (Week 1): Pt will initiate gait training with RW while maintaining NWB PT Short Term Goal 2 (Week 1): Pt will transfer to and from [facility] with min assist using RW. PT Short Term Goal 3 (Week 1): Pt will perform sit<>stand from [facility] with min assist PT Short Term Goal 4 (Week 1): Pt will propell WC through rehab unit without assist x 124ft  Skilled Therapeutic Interventions/Progress Updates:   Received pt sitting in bed with RN present administering medications, pt agreeable to therapy, and reported pain 6/10 in L LE (premedicated). Repositioning and rest breaks done to reduce pain levels. Session with emphasis on functional mobility/transfers, generalized strengthening, dynamic standing balance/coordination, simulated car transfers, and improved activity tolerance. L bledsoe brace donned and locked into extension. Pt with lunch tray untouched and requested to take a few bites prior to leaving room. Pt ate lunch sitting in bed independently. While pt ate lunch discussed home setup and equipment she will require at D/C. Pt reported she would rather not have a WC upon D/C due to narrow doorways and tight spaces. Pt with questions regarding ambulation and therapist provided visual demonstration for "hopping" technique with emphasis on slow/controlled descent to prevent compression force on R knee. Pt continues to remain verbose and requires cues for re-direction to remain on task. Pt reported urge to urinate and able to void in female urinal independently. Pt transferred sitting in bed<>sitting EOB with supervision and donned R shoe independently in figure 4 position with increased time. Pt transferred bed<>WC via lateral scoot with supervision. Pt  performed WC mobility 190ft using bilateral UEs and supervision to therapy gym. Pt performed simulated car transfer with RW and min A stand<>pivot. Pt required max cues for technique and pivoting on R LE and required significantly increased time due to increased R knee pain. Pt transported back to room in [facility] total A and transferred WC<>bed via lateral scoot with supervision. Pt doffed R shoe independently. Concluded session with pt sitting in bed, needs within reach, and bed alarm on.    Therapy Documentation Precautions:  Precautions Precautions: Fall Precaution Comments: anxious Required Braces or Orthoses: Other Brace Knee Immobilizer - Left: On at all times Other Brace: hinge knee brace, locked into extension at all times Restrictions Weight Bearing Restrictions: Yes RLE Weight Bearing: Weight bearing as tolerated LLE Weight Bearing: Non weight bearing   Therapy/Group: Individual Therapy Martin Majestic PT, DPT   07/11/2020, 7:38 AM

## 2020-07-11 NOTE — Plan of Care (Signed)
  Problem: RH SKIN INTEGRITY Goal: RH STG SKIN FREE OF INFECTION/BREAKDOWN Description: With min assist Outcome: Progressing Goal: RH STG MAINTAIN SKIN INTEGRITY WITH ASSISTANCE Description: STG Maintain Skin Integrity With min Assistance. Outcome: Progressing Goal: RH STG ABLE TO PERFORM INCISION/WOUND CARE W/ASSISTANCE Description: STG Able To Perform Incision/Wound Care With min Assistance. Outcome: Progressing   Problem: RH SAFETY Goal: RH STG ADHERE TO SAFETY PRECAUTIONS W/ASSISTANCE/DEVICE Description: STG Adhere to Safety Precautions With cues/reminders Assistance/Device. Outcome: Progressing   Problem: RH PAIN MANAGEMENT Goal: RH STG PAIN MANAGED AT OR BELOW PT'S PAIN GOAL Description: At or below level 4 Outcome: Progressing   Problem: RH KNOWLEDGE DEFICIT GENERAL Goal: RH STG INCREASE KNOWLEDGE OF SELF CARE AFTER HOSPITALIZATION Description: Patient will be able to manage self care at discharge using handouts and educational information independently Outcome: Progressing

## 2020-07-11 NOTE — Progress Notes (Signed)
Occupational Therapy Session Note  Patient Details  Name: Megan Bailey MRN: [id number] Date of Birth: 03-Feb-1981  Today's Date: 07/11/2020 OT Individual Time: 2951-8841 OT Individual Time Calculation (min): 75 min    Short Term Goals: Week 1:  OT Short Term Goal 1 (Week 1): Pt will transfer to [facility] with MIN A and LRAD OT Short Term Goal 2 (Week 1): Pt will thread BLE into pants with AE PRN OT Short Term Goal 3 (Week 1): Pt will STS wiht LRAD and good adherence ot precautions with MIN A OT Short Term Goal 4 (Week 1): Pt will bathe with supervision and VC only (AE PRN)  Skilled Therapeutic Interventions/Progress Updates:    Treatment session with focus on self-care retraining, functional transfers, and BUE strengthening.  Pt received upright in bed with multiple complaints about her breakfast tray being late and not being ready with breakfast and meds completed prior to session.  Pt requires redirection due to frequent complaints and encouragement to remain on task to complete meal tray to allow for time in therapy.  Pt reported completing dressing prior to session at bed level without assistance.  Pt able to reach towards feet in long sitting in bed to don pants, socks, and shoes.  Completed bed mobility with supervision to come to sitting at EOB.  Lateral scoot transfer without slide board bed > w/c with CGA and cues for NWB through LLE.  Pt completed hair care and oral care seated at sink with increased time.  Pt propelled w/c 100' without assistance to therapy gym.  Engaged in BUE strengthening with focus on triceps.  Completed 2 sets of 10 overhead tricep presses with 7#, and 2 sets of 15 tricep rows with 10#.  Educated on importance of BUE strengthening and maintaining strength and endurance as needed due to increased demand on BUE for mobility.  Pt returned to room propelling w/c and remained upright in w/c with all needs in reach and chair alarm on.  Therapy Documentation Precautions:   Precautions Precautions: Fall Precaution Comments: anxious Required Braces or Orthoses: Other Brace Knee Immobilizer - Left: On at all times Other Brace: hinge knee brace, locked into extension at all times Restrictions Weight Bearing Restrictions: Yes RLE Weight Bearing: Weight bearing as tolerated LLE Weight Bearing: Non weight bearing Pain:  Pt with c/o pain 8/10 in Rt thigh with mobility and WB. Premedicated  Therapy/Group: Individual Therapy  Rosalio Loud 07/11/2020, 10:04 AM
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
## 2020-07-12 ENCOUNTER — Inpatient Hospital Stay (HOSPITAL_COMMUNITY): Payer: Self-pay

## 2020-07-12 ENCOUNTER — Inpatient Hospital Stay (HOSPITAL_COMMUNITY): Payer: Medicaid Other

## 2020-07-12 ENCOUNTER — Inpatient Hospital Stay (HOSPITAL_COMMUNITY): Payer: Medicaid Other | Admitting: Occupational Therapy

## 2020-07-12 DIAGNOSIS — K5903 Drug induced constipation: Secondary | ICD-10-CM

## 2020-07-12 MED ORDER — LIDOCAINE 5 % EX PTCH
1.0000 | MEDICATED_PATCH | CUTANEOUS | Status: DC
  Administered 2020-07-12 – 2020-07-18 (×7): 1 via TRANSDERMAL
  Filled 2020-07-12 (×9): qty 1
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
## 2020-07-12 NOTE — Progress Notes (Signed)
Occupational Therapy Session Note  Patient Details  Name: Megan Bailey MRN: [id number] Date of Birth: January 22, 1981  Today's Date: 07/12/2020 OT Individual Time: 5284-1324 and 4010-2725 OT Individual Time Calculation (min): 50 min and 43 min   Short Term Goals: Week 1:  OT Short Term Goal 1 (Week 1): Pt will transfer to [facility] with MIN A and LRAD OT Short Term Goal 2 (Week 1): Pt will thread BLE into pants with AE PRN OT Short Term Goal 3 (Week 1): Pt will STS wiht LRAD and good adherence ot precautions with MIN A OT Short Term Goal 4 (Week 1): Pt will bathe with supervision and VC only (AE PRN)  Skilled Therapeutic Interventions/Progress Updates:    1) Treatment session with focus on self-care retraining, functional transfers, and adherence to WB precautions during mobility.  Pt received upright in bed expressing desire to shower.  Pt completed lateral scoot transfer with close supervision to w/c.  Stand pivot transfer w/c > tub bench with RW with min assist and cues to maintain NWB through LLE.  Therapist covered LLE to keep dry during shower.  Pt completed bathing with distant supervision with lateral leans to wash buttocks.  Sit > stand with CGA with RW to transfer back to w/c.  Completed UB dressing with setup assist.  Transferred back to bed stand pivot with RW CGA with cues for NWB through LLE.  Pt donned underwear seated EOB and returned to supine in bed to pull underwear over hips.  Pt to don pants after ortho looks at leg.  Pt remained upright in bed with all needs in reach.  2) Treatment session with focus on functional transfers and care for/bracing of LLE.  Pt received upright in bed reporting desire to complete grooming tasks at sink.  Pt completed bed mobility with supervision.  Sit > stand and stand pivot transfer with RW bed > w/c with CGA.  Pt required increased time due to new boot on Lt foot.  Therapist noted Lt knee brace to not have correct fit, not secure at knee allowing slight  flexion.  Therefore therapist assisted with adjusting brace to secure at knee.  Pt engaged in grooming tasks in sitting at sink.  Discussed home bathroom setup, pt reports plan to have boyfriend take a picture of the tub/shower to plan for recommended DME.  Pt remained upright in w/c with all needs in reach.  Therapy Documentation Precautions:  Precautions Precautions: Fall Precaution Comments: anxious Required Braces or Orthoses: Other Brace Knee Immobilizer - Left: On at all times Other Brace: hinge knee brace, locked into extension at all times Restrictions Weight Bearing Restrictions: Yes RLE Weight Bearing: Weight bearing as tolerated LLE Weight Bearing: Non weight bearing General:   Vital Signs: Therapy Vitals Temp: 98.3 F (36.8 C) Temp Source: Oral Pulse Rate: 74 BP: (!) 94/57 Patient Position (if appropriate): Lying Oxygen Therapy SpO2: 100 % O2 Device: Room Air Pain: Pain Assessment Pain Scale: 0-10 Pain Score: 6  Premedicated at beginning of session.  Therapy/Group: Individual Therapy  Rosalio Loud 07/12/2020, 8:33 AM

## 2020-07-12 NOTE — Progress Notes (Signed)
Orthopedic Tech Progress Note Patient Details:  Megan Bailey 09-21-1981 [id number]  Ortho Devices Type of Ortho Device: CAM walker Ortho Device/Splint Location: LLE Ortho Device/Splint Interventions: Ordered, Application   Post Interventions Patient Tolerated: Well Instructions Provided: Care of device   Donald Pore 07/12/2020, 12:36 PM

## 2020-07-12 NOTE — Plan of Care (Signed)
  Problem: Consults Goal: RH GENERAL PATIENT EDUCATION Description: See Patient Education module for education specifics. Outcome: Progressing   Problem: RH SKIN INTEGRITY Goal: RH STG SKIN FREE OF INFECTION/BREAKDOWN Description: With min assist Outcome: Progressing   Problem: RH SKIN INTEGRITY Goal: RH STG MAINTAIN SKIN INTEGRITY WITH ASSISTANCE Description: STG Maintain Skin Integrity With min Assistance. Outcome: Progressing   Problem: RH SKIN INTEGRITY Goal: RH STG ABLE TO PERFORM INCISION/WOUND CARE W/ASSISTANCE Description: STG Able To Perform Incision/Wound Care With min Assistance. Outcome: Progressing
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
## 2020-07-12 NOTE — Progress Notes (Signed)
Physical Therapy Session Note  Patient Details  Name: Megan Bailey MRN: [id number] Date of Birth: 1981-02-18  Today's Date: 07/12/2020 PT Individual Time: 1415-1526 71 min PT Missed Time: 60 Minutes Missed Time Reason: Unavailable (Comment) (off unit for x-ray)  Short Term Goals: Week 1:  PT Short Term Goal 1 (Week 1): Pt will initiate gait training with RW while maintaining NWB PT Short Term Goal 2 (Week 1): Pt will transfer to and from [facility] with min assist using RW. PT Short Term Goal 3 (Week 1): Pt will perform sit<>stand from [facility] with min assist PT Short Term Goal 4 (Week 1): Pt will propell WC through rehab unit without assist x 16ft  Skilled Therapeutic Interventions/Progress Updates:   Treatment Session 1 Per RN, pt off unit for x-ray and will return soon. Therapist returned 30 minutes later and pt still off unit. Will make up time as able. 60 minutes missed of skilled physical therapy due to pt being off unit for x-rays.   Treatment Session 2: 1415-1526 71 min Received pt sitting in WC, pt agreeable to therapy, and reported pain 7/10 in R LE (premedicated). Repositioning and rest breaks done to reduce pain levels. Session with emphasis on functional mobility/transfers, generalized strengthening, dynamic standing balance/coordination, ambulation, and improved activity tolerance. L bledsoe brace donned during session. Recreational therapy present during session. Pt performed WC mobility 131ft using bilateral UEs and supervision to laundry room and therapist put pt's clothes in washer total A for time management. Pt transferred sit<>stand in // bars with CGA and ambulated 101ft x 1 trial in // bars with close supervision and WC follow while maintaining L LE NWB precautions. Pt c/o increased R knee pain but demonstrated good balance and foot clearance. Pt continues to remain verbose and requires max cues for sustained attention. Discussed strategies for stair navigation including hopping up  backwards, using shower chair, or getting a temporary ramp installed. Pt with hesitations with all 3 options. Therapist demonstrated hopping up backards and shower chair technique and pt agreeable to attempt hopping up backwards. Pt became emotional stating that she feels like she cannot rely on her boyfriend at home to assist. Therapist provided emotional support and encouragement. Pt transferred sit<>stand with RW and CGA and attempted to hop up 1 6in step backwards with RW. Pt able to place R foot on step but immediately reported increased R knee pain and instantly took foot off step. Returned to [facility] due to increased knee pain. Pt transported back to room in [facility] total A and transferred WC<>bed via lateral scoot with supervision. Concluded session with pt sitting in bed, needs within reach, and bed alarm on.   Therapy Documentation Precautions:  Precautions Precautions: Fall Precaution Comments: anxious Required Braces or Orthoses: Other Brace Knee Immobilizer - Left: On at all times Other Brace: hinge knee brace, locked into extension at all times Restrictions Weight Bearing Restrictions: Yes RLE Weight Bearing: Weight bearing as tolerated LLE Weight Bearing: Non weight bearing  Therapy/Group: Individual Therapy Martin Majestic PT, DPT   07/12/2020, 7:18 AM

## 2020-07-13 ENCOUNTER — Inpatient Hospital Stay (HOSPITAL_COMMUNITY): Payer: Medicaid Other

## 2020-07-13 ENCOUNTER — Inpatient Hospital Stay (HOSPITAL_COMMUNITY): Payer: Medicaid Other | Admitting: Occupational Therapy

## 2020-07-13 ENCOUNTER — Inpatient Hospital Stay (HOSPITAL_COMMUNITY): Payer: Medicaid Other | Admitting: *Deleted

## 2020-07-13 ENCOUNTER — Inpatient Hospital Stay (HOSPITAL_COMMUNITY): Payer: Medicaid Other | Admitting: Physical Therapy

## 2020-07-13 MED ORDER — ACETAMINOPHEN 325 MG PO TABS: 650.0000 mg | ORAL_TABLET | Freq: Three times a day (TID) | ORAL | Status: AC

## 2020-07-13 NOTE — Plan of Care (Signed)
  Problem: Consults Goal: RH GENERAL PATIENT EDUCATION Description: See Patient Education module for education specifics. Outcome: Progressing   Problem: RH SKIN INTEGRITY Goal: RH STG SKIN FREE OF INFECTION/BREAKDOWN Description: With min assist Outcome: Progressing Goal: RH STG MAINTAIN SKIN INTEGRITY WITH ASSISTANCE Description: STG Maintain Skin Integrity With min Assistance. Outcome: Progressing Goal: RH STG ABLE TO PERFORM INCISION/WOUND CARE W/ASSISTANCE Description: STG Able To Perform Incision/Wound Care With min Assistance. Outcome: Progressing   Problem: RH SAFETY Goal: RH STG ADHERE TO SAFETY PRECAUTIONS W/ASSISTANCE/DEVICE Description: STG Adhere to Safety Precautions With cues/reminders Assistance/Device. Outcome: Progressing   Problem: RH PAIN MANAGEMENT Goal: RH STG PAIN MANAGED AT OR BELOW PT'S PAIN GOAL Description: At or below level 4 Outcome: Progressing   Problem: RH KNOWLEDGE DEFICIT GENERAL Goal: RH STG INCREASE KNOWLEDGE OF SELF CARE AFTER HOSPITALIZATION Description: Patient will be able to manage self care at discharge using handouts and educational information independently Outcome: Progressing   

## 2020-07-13 NOTE — Progress Notes (Addendum)
Physical Therapy Session Note  Patient Details  Name: Megan Bailey MRN: [id number] Date of Birth: Mar 26, 1981  Today's Date: 07/13/2020 PT Individual Time: 0803-0911 PT Individual Time Calculation (min): 68 min   Short Term Goals: Week 1:  PT Short Term Goal 1 (Week 1): Pt will initiate gait training with RW while maintaining NWB PT Short Term Goal 2 (Week 1): Pt will transfer to and from [facility] with min assist using RW. PT Short Term Goal 3 (Week 1): Pt will perform sit<>stand from [facility] with min assist PT Short Term Goal 4 (Week 1): Pt will propell WC through rehab unit without assist x 160ft  Skilled Therapeutic Interventions/Progress Updates:   Received pt supine in bed, pt agreeable to therapy, and reported pain 6/10 in bilateral knees and L ankle. RN made aware and administered pain medication during session. Repositioning and rest breaks done to reduce pain levels. Session with emphasis on functional mobility/transfers, generalized strengthening, dynamic standing balance/coordination, ambulation, and improved activity tolerance. Pt continues to remain verbose and requires cues for redirection and increased time for all mobility due to tendency to get distracted. Pt donned L cam boot and bledsoe brace long sitting in bed with supervision and increased time. Donned pants sitting in bed with supervision and increased time. Pt very emotional regarding discharging home with her boyfriend. Pt feels like she is not able to count on her boyfriend to assist as needed. Therapist suggested speaking with boyfriend's mother who has offered assist with finding equipment for pt. Pt reported her boyfriend's mother may be able to assist. Pt also with concerns regarding practicing stair navigation yesterday. MD present for morning rounds and therapist updated MD on difficulty with stair navigation and concerns with discharge plan. Donned R shoe in figure 4 position with supervision and increased time. Pt transferred  bed<>WC via lateral scoot with supervision and transported to dayroom in [facility] total A. Therapist took WC measurements to fit pt for 16x16 manual WC. Pt ambulated 46ft with RW and close supervision from dayroom to room while demonstrating TDWB on L LE. Pt c/o increased R knee pain and difficulty lifting L boot from floor and required encouragement to continue ambulating. Concluded session with pt sitting EOB, needs within reach, and bed alarm on.  Therapy Documentation Precautions:  Precautions Precautions: Fall Precaution Comments: anxious Required Braces or Orthoses: Other Brace Knee Immobilizer - Left: On at all times Other Brace: hinge knee brace, locked into extension at all times Restrictions Weight Bearing Restrictions: Yes RLE Weight Bearing: Weight bearing as tolerated LLE Weight Bearing: Non weight bearing  Therapy/Group: Individual Therapy Martin Majestic PT, DPT   07/13/2020, 7:24 AM

## 2020-07-13 NOTE — Progress Notes (Signed)
Physical Therapy Session Note  Patient Details  Name: Megan Bailey MRN: [id number] Date of Birth: September 08, 1981  Today's Date: 07/13/2020 PT Individual Time: 1107-1205 PT Individual Time Calculation (min): 58 min   Short Term Goals: Week 1:  PT Short Term Goal 1 (Week 1): Pt will initiate gait training with RW while maintaining NWB PT Short Term Goal 2 (Week 1): Pt will transfer to and from [facility] with min assist using RW. PT Short Term Goal 3 (Week 1): Pt will perform sit<>stand from [facility] with min assist PT Short Term Goal 4 (Week 1): Pt will propell WC through rehab unit without assist x 157ft  Skilled Therapeutic Interventions/Progress Updates:    Pt received sitting in bed fixing her hair and agreeable to therapy session. Pt with increased hyper-verbosity this session requiring frequent redirection to initiation mobility tasks. Pt reports that due to insurance limitations she will only be able to receive a wheelchair or RW (not both) and is unsure which item will fit best in her home and that due to personal reasons she has been unable to ask her significant other to take measurements - reinforced the importance of this information to help facilitate safe discharge plan. Pt wearing L LE Bledsoe brace and boot throughout session. L lateral scoot EOB>w/c with close supervision for safety. Requires assist for donning leg rests. Transported to/from gym in w/c for time management and energy conservation.   Pt reports that she has not experienced any further vestibular symptoms of "the room spinning" since this therapist performed the canalith repositioning maneuver. Pt agreeable for vestibular reassessment.  L [place]: no nystagmus and pt denies any symptoms  Assessed R [place]: no nystagmus and pt denies any symptoms Educated pt to continue monitoring for vestibular symptoms during bed mobility as that is when it was most provoked in the past and to notify primary PT if any symptoms occur  requiring additional follow-up with this therapist.  Remainder of therapy session focused on discharge planning and gait training. Sit<>stands to/from RW with close supervision for safety - pt reports L foot now sticking to the ground due to the rubber bottom of the boot. Pt reports that during standing she will rest her foot on the ground due to the weight of her leg being too much to hold up - therapist ensured pt understood the NWBing precautions. Gait training ~6ft using RW with close supervision/CGA for safety as pt intermittently has instability when not clearing L foot adequately slightly catching boot on ground or not placing RW correctly to allow hop-to gait pattern - cuing throughout for improvement. R stand pivot EOM>w/c using RW with close supervision. Discussed recommendation for follow-up PT and made patient aware of some outpatient pro-bono services offered in the area but due to pt's remote location it would be more challenging to get to and pt does not have someone to transport her. Educated pt on importance of really focusing during therapy sessions to progress her mobility and increase her independence as much as possible prior to discharge. R lateral scoot w/c>EOB with close supervision. Sit>long sitting on bed mod-I. Pt left in bed with needs in reach and bed alarm on.  Therapy Documentation Precautions:  Precautions Precautions: Fall Precaution Comments: anxious Required Braces or Orthoses: Other Brace Knee Immobilizer - Left: On at all times Other Brace: hinge knee brace, locked into extension at all times Restrictions Weight Bearing Restrictions: Yes RLE Weight Bearing: Weight bearing as tolerated LLE Weight Bearing: Non weight bearing  Pain:   Reports R thigh pain with ambulation, unrated, provided rest breaks, emotional support, and distraction for pain management.  Therapy/Group: Individual Therapy  Ginny Forth , PT, DPT, CSRS  07/13/2020, 8:03 AM

## 2020-07-13 NOTE — Progress Notes (Signed)
Occupational Therapy Session Note  Patient Details  Name: Donnesha Karg MRN: [id number] Date of Birth: Dec 11, 1980  Today's Date: 07/13/2020 OT Individual Time: 1300-1418 OT Individual Time Calculation (min): 78 min    Short Term Goals: Week 1:  OT Short Term Goal 1 (Week 1): Pt will transfer to [facility] with MIN A and LRAD OT Short Term Goal 2 (Week 1): Pt will thread BLE into pants with AE PRN OT Short Term Goal 3 (Week 1): Pt will STS wiht LRAD and good adherence ot precautions with MIN A OT Short Term Goal 4 (Week 1): Pt will bathe with supervision and VC only (AE PRN)  Skilled Therapeutic Interventions/Progress Updates:    Patient in bed, alert and ready for therapy.  She notes pain "6" in bilateral knees, left ankle - nursing providing meds at this time.  Supine to sitting edge of bed with CS.  Sit to stand and ambulation with RW to/from bed, commode, w/c with CS and occ cues for strategies to maintain WB and pain reduction.  toileting completed on commode over toilet with CS for CM and hygiene.  Reviewed and practiced reach and transport of items in kitchen environment, discussed w/c vs RW mobility, reviewed sit to stand from chair without arms at table.  Provided walker bag.   She is able to perform reach and transport of kitchen items with good carryover of strategies with CS.   Discussed positioning of furniture in home environment and short term strategies to promote healing with good problem solving.  She returned to bed at close of session with CS.  Bed alarm set and call bell/tray table in reach.    Therapy Documentation Precautions:  Precautions Precautions: Fall Precaution Comments: anxious Required Braces or Orthoses: Other Brace Knee Immobilizer - Left: On at all times Other Brace: hinge knee brace, locked into extension at all times Restrictions Weight Bearing Restrictions: Yes RLE Weight Bearing: Weight bearing as tolerated LLE Weight Bearing: Non weight  bearing   Therapy/Group: Individual Therapy  Barrie Lyme 07/13/2020, 7:56 AM
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
## 2020-07-14 ENCOUNTER — Inpatient Hospital Stay (HOSPITAL_COMMUNITY): Payer: Medicaid Other

## 2020-07-14 ENCOUNTER — Inpatient Hospital Stay (HOSPITAL_COMMUNITY): Payer: Medicaid Other | Admitting: Occupational Therapy

## 2020-07-14 DIAGNOSIS — R0989 Other specified symptoms and signs involving the circulatory and respiratory systems: Secondary | ICD-10-CM

## 2020-07-14 DIAGNOSIS — I959 Hypotension, unspecified: Secondary | ICD-10-CM

## 2020-07-14 MED ORDER — SULFAMETHOXAZOLE-TRIMETHOPRIM 800-160 MG PO TABS
1.0000 | ORAL_TABLET | Freq: Two times a day (BID) | ORAL | Status: AC
  Administered 2020-07-14 – 2020-07-18 (×10): 1 via ORAL
  Filled 2020-07-14 (×10): qty 1

## 2020-07-14 NOTE — Progress Notes (Signed)
Occupational Therapy Weekly Progress Note  Patient Details  Name: Megan Bailey MRN: [id number] Date of Birth: [birth year]  Beginning of progress report period: July 08, 2020 End of progress report period: July 14, 2020  Today's Date: 07/14/2020 OT Individual Time: 8309-4076 OT Individual Time Calculation (min): 59 min    Patient has met 4 of 4 short term goals.  Pt is making steady progress towards goals.  Pt is currently able to complete lateral scoot transfers with close supervision and stand pivot transfers with RW with CGA and intermittent cues to maintain WB status.  Pt is currently able to complete dressing with setup assist both at bed level and CGA at sit > stand level for pants.  Pt has demonstrated ability to complete bathing with setup to cover leg prior to shower.  Pt continues to be limited by pain in BLE and internal distraction impacting participation.  Patient continues to demonstrate the following deficits: muscle weakness, decreased cardiorespiratoy endurance and decreased standing balance, decreased balance strategies and difficulty maintaining precautions and therefore will continue to benefit from skilled OT intervention to enhance overall performance with BADL and Reduce care partner burden.  Patient progressing toward long term goals..  Continue plan of care.  OT Short Term Goals Week 1:  OT Short Term Goal 1 (Week 1): Pt will transfer to [facility] with MIN A and LRAD OT Short Term Goal 1 - Progress (Week 1): Met OT Short Term Goal 2 (Week 1): Pt will thread BLE into pants with AE PRN OT Short Term Goal 2 - Progress (Week 1): Met OT Short Term Goal 3 (Week 1): Pt will STS wiht LRAD and good adherence ot precautions with MIN A OT Short Term Goal 3 - Progress (Week 1): Met OT Short Term Goal 4 (Week 1): Pt will bathe with supervision and VC only (AE PRN) OT Short Term Goal 4 - Progress (Week 1): Met Week 2:  OT Short Term Goal 1 (Week 2): STG = LTGs due to remaining  LOS  Skilled Therapeutic Interventions/Progress Updates:    Treatment session with focus on self-care retraining, LB dressing to include donning bledsoe brace and boot, and functional transfers.  Pt received upright in bed completing LB bathing.  Pt adjusted bledsoe brace and donned ankle boot with increased time and cues for technique while maintaining precautions.  Pt donned pants with increased time and ability to don Rt shoe this session at bed level.  Completed bed mobility supervision to EOB.  Increased time for sit > stand at EOB due to pain and fearfulness of pain with mobility.  Pt completed sit > stand and stand pivot transfer to w/c with RW with close supervision.  Pt propelled w/c to sink to complete grooming tasks.  Pt remained upright in w/c at sink to complete remainder of grooming tasks.  Pt continues to be internally distracted and requires increased time and encouragement due to pain.  Therapy Documentation Precautions:  Precautions Precautions: Fall Precaution Comments: anxious Required Braces or Orthoses: Other Brace Knee Immobilizer - Left: On at all times Other Brace: hinge knee brace, locked into extension at all times Restrictions Weight Bearing Restrictions: Yes RLE Weight Bearing: Weight bearing as tolerated LLE Weight Bearing: Non weight bearing General:   Vital Signs: Therapy Vitals Temp: 97.9 F (36.6 C) Pulse Rate: 79 BP: (!) 88/53 Patient Position (if appropriate): Lying Oxygen Therapy SpO2: 96 % O2 Device: Room Air Pain: Pain Assessment Pain Scale: 0-10 Pain Score: 7  Pain Type: Acute  pain Pain Location: Ankle Pain Orientation: Left Pain Descriptors / Indicators: Aching;Sore Pain Onset: On-going Patients Stated Pain Goal: 2 Pain Intervention(s): Medication (See eMAR)   Therapy/Group: Individual Therapy  Simonne Come 07/14/2020, 7:50 AM

## 2020-07-14 NOTE — Progress Notes (Signed)
Physical Therapy Session Note  Patient Details  Name: Megan Bailey MRN: [id number] Date of Birth: [birth year]  Today's Date: 07/14/2020 PT Individual Time: 0315-9458 and 5929-2446 PT Individual Time Calculation (min): 69 min and 57 min  Short Term Goals: Week 1:  PT Short Term Goal 1 (Week 1): Pt will initiate gait training with RW while maintaining NWB PT Short Term Goal 1 - Progress (Week 1): Met PT Short Term Goal 2 (Week 1): Pt will transfer to and from [facility] with min assist using RW. PT Short Term Goal 2 - Progress (Week 1): Met PT Short Term Goal 3 (Week 1): Pt will perform sit<>stand from [facility] with min assist PT Short Term Goal 3 - Progress (Week 1): Met PT Short Term Goal 4 (Week 1): Pt will propell WC through rehab unit without assist x 144f PT Short Term Goal 4 - Progress (Week 1): Met Week 2:  PT Short Term Goal 1 (Week 2): STG=LTG due to LOS  Skilled Therapeutic Interventions/Progress Updates:  Ambulation/gait training;Balance/vestibular training;Community reintegration;Discharge planning;Disease management/prevention;DME/adaptive equipment instruction;Functional electrical stimulation;Functional mobility training;Patient/family education;Pain management;Psychosocial support;Skin care/wound management;Splinting/orthotics;Stair training;Therapeutic Activities;Therapeutic Exercise;UE/LE Strength taining/ROM;UE/LE Coordination activities;Visual/perceptual remediation/compensation;Wheelchair propulsion/positioning   Today's Interventions: Treatment Session 1: 1045-1154 69 min Received pt sitting in WC, pt agreeable to therapy, and reported pain 6/10 in bilateral knees (premedicated). Repositioning, rest breaks, and distraction done to reduce pain levels. Session with emphasis on functional mobility/transfers, generalized strengthening, dynamic standing balance/coordination, ambulation, stair navigation, and improved activity tolerance. Pt performed WC mobility 1567fusing bilateral UEs  and supervision to therapy gym. Pt navigated 4 steps with 2 rails ascending backwards and descending forwards with CGA while maintaining L LE NWB precautions. Pt hesitant with stair navigation requiring extensive time and convincing and multiple demonstrations from therapist to ease pt's anxiety. Pt reported her rails at home may be wider and therapist suggested getting boyfriend to measure distance between rails. Discussed getting WC vs RW upon d/c and various options for ordering RW. Looked through various RW options on [employer] CorporationPt transported [facility] and performed WC mobility on uneven surfaces (ramp) x 2 trials. Trial 1 with min A to navigate threshold and trial 2 with supervision and cues for control and cadence. Pt ambulated 1048fn uneverns urfaces (ramp) with RW and close supervision with cues for RW safety and body mechanics. Worked on dynamic standing balance tossing horseshoes with R UE x 2 trials with supervision while maintaining L LE NWB precautions. Pt [facility] and ambulated 21f72fth RW and supervision back to bed. Concluded session with pt sitting EOB with PA present to take out sutures. Needs within reach and bed alarm on.   Treatment Session 2: [id number] Received pt sitting in bed finishing lunch, pt agreeable to therapy, and reported pain 4/10 in R knee (premedicated). Repositioning, rest breaks, and distraction done to reduce pain levels. Session with emphasis on functional mobility/transfers, toileting, generalized strengthening, dynamic standing balance/coordination, ambulation, and improved activity tolerance. Pt reported urge to urinate and donned L cam boot independently and performed bed mobility mod I and ambulated 15ft80f trials to/from bathroom with RW and close supervision. Pt able to void, perform peri-care, and clothing management independently. Pt stood at sink and washed hands with supervision. Pt ambulated 100ft 26f RW  and supervision to dayroom with increased time while maintaining L LE NWB precautions. Pt performed bilateral UE and R LE strengthening on Nustep at workload  5 for 12 minutes for a total of 442 steps for improved cardiovascular endurance. Pt ambulated additional 173f with RW and supervision back to room and performed bed mobility independent. Discussed further equipment for discharge. Concluded session with pt sitting in bed, needs within reach, and bed alarm on.   Therapy Documentation Precautions:  Precautions Precautions: Fall Precaution Comments: anxious Required Braces or Orthoses: Other Brace Knee Immobilizer - Left: On at all times Other Brace: hinge knee brace, locked into extension at all times Restrictions Weight Bearing Restrictions: Yes RLE Weight Bearing: Weight bearing as tolerated LLE Weight Bearing: Non weight bearing  Therapy/Group: Individual Therapy AAlfonse AlpersPT, DPT   07/14/2020, 7:25 AM

## 2020-07-14 NOTE — Progress Notes (Signed)
Recreational Therapy Session Note  Patient Details  Name: Megan Bailey MRN: [id number] Date of Birth: 12-14-1980 Today's Date: 07/14/2020  Pain: no c/o Skilled Therapeutic Interventions/Progress Updates: Session focused on discharge planning, community reintegration w/c and ambulatory level using RW during co-treat with PT.  Pt propels w/c throughout the unit with supervision. Pt performed 4 steps in the gym using B rails, going up/down backwards with contact guard assist.  Pt also propelled w/c up and down a ramp with supervision. Pt then ambulated up and down a ramp with contact guard assistance.  Education provided on accessing public restroom.  Pt stated understanding.  Therapy/Group: Co-Treatment  Tonie Elsey 07/14/2020, 12:09 PM

## 2020-07-14 NOTE — Progress Notes (Signed)
Physical Therapy Weekly Progress Note  Patient Details  Name: Megan Bailey MRN: [id number] Date of Birth: 1981-03-02  Beginning of progress report period: July 08, 2020 End of progress report period: July 14, 2020  Patient has met 4 of 4 short term goals. Pt is currently making steady progress towards long term goals. Pt is currently able to perform bed mobility with supervision with HOB elevated and use of bedrails, lateral scoot transfers with supervision and stand<>pivot and sit<>stand transfers with RW and CGA/close supervision, ambulate 83f with RW and close supervision while maintaining L LE NWB precautions, perform WC mobility up to 1557fusing bilateral UEs and supervision, and navigate 4 steps with 2 rails CGA. Pt continues to be severely limited by R knee pain and requires increased time for mobility due to tendency to get distracted easily. Pt has expressed concerns regarding d/c stating that she cannot rely on her boyfriend to assist and has to be able to navigate steps as a ramp is not an option.   Patient continues to demonstrate the following deficits muscle weakness and decreased standing balance, decreased postural control, decreased balance strategies and difficulty maintaining precautions and therefore will continue to benefit from skilled PT intervention to increase functional independence with mobility.  Patient progressing toward long term goals..  Continue plan of care.  PT Short Term Goals Week 1:  PT Short Term Goal 1 (Week 1): Pt will initiate gait training with RW while maintaining NWB PT Short Term Goal 1 - Progress (Week 1): Met PT Short Term Goal 2 (Week 1): [facility]W. PT Short Term Goal 2 - Progress (Week 1): Met PT Short Term Goal 3 (Week 1): Pt [facility] perform sit<>[facility] PT Short Term Goal 3 - Progress (Week 1): Met PT Short Term Goal 4 (Week 1): Pt will propell WC through rehab unit  without assist x 15058fT Short Term Goal 4 - Progress (Week 1): Met Week 2:  PT Short Term Goal 1 (Week 2): STG=LTG due to LOS  Skilled Therapeutic Interventions/Progress Updates:  Ambulation/gait training;Balance/vestibular training;Community reintegration;Discharge planning;Disease management/prevention;DME/adaptive equipment instruction;Functional electrical stimulation;Functional mobility training;Patient/family education;Pain management;Psychosocial support;Skin care/wound management;Splinting/orthotics;Stair training;Therapeutic Activities;Therapeutic Exercise;UE/LE Strength taining/ROM;UE/LE Coordination activities;Visual/perceptual remediation/compensation;Wheelchair propulsion/positioning   Therapy Documentation Precautions:  Precautions Precautions: Fall Precaution Comments: anxious Required Braces or Orthoses: Other Brace Knee Immobilizer - Left: On at all times Other Brace: hinge knee brace, locked into extension at all times Restrictions Weight Bearing Restrictions: Yes RLE Weight Bearing: Weight bearing as tolerated LLE Weight Bearing: Non weight bearing  Therapy/Group: Individual Therapy AnnAlfonse Alpers, DPT   07/14/2020, 7:23 AM
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
## 2020-07-14 NOTE — Plan of Care (Signed)
°  Problem: Consults Goal: RH GENERAL PATIENT EDUCATION Description: See Patient Education module for education specifics. Outcome: Progressing   Problem: RH SKIN INTEGRITY Goal: RH STG SKIN FREE OF INFECTION/BREAKDOWN Description: With min assist Outcome: Progressing Goal: RH STG MAINTAIN SKIN INTEGRITY WITH ASSISTANCE Description: STG Maintain Skin Integrity With min Assistance. Outcome: Progressing Goal: RH STG ABLE TO PERFORM INCISION/WOUND CARE W/ASSISTANCE Description: STG Able To Perform Incision/Wound Care With min Assistance. Outcome: Progressing   Problem: RH SAFETY Goal: RH STG ADHERE TO SAFETY PRECAUTIONS W/ASSISTANCE/DEVICE Description: STG Adhere to Safety Precautions With cues/reminders Assistance/Device. Outcome: Progressing   Problem: RH PAIN MANAGEMENT Goal: RH STG PAIN MANAGED AT OR BELOW PT'S PAIN GOAL Description: At or below level 4 Outcome: Progressing   Problem: RH KNOWLEDGE DEFICIT GENERAL Goal: RH STG INCREASE KNOWLEDGE OF SELF CARE AFTER HOSPITALIZATION Description: Patient will be able to manage self care at discharge using handouts and educational information independently Outcome: Progressing   

## 2020-07-14 NOTE — Progress Notes (Signed)
Repeat x-rays performed yesterday have been reviewed. All imaging appears stable. Remaining sutures removed from left distal tibia and heel. Incisions healing with no surrounding erythema and no drainage. Left knee incision with small amount of redness over distal portion.  - Ok to leave all incisions open to air. - Started Bactrim x 5 days for knee incision for infection prophylaxis - Will continue to follow patient while in hospital. Plan for outpatient follow-up 2 weeks after discharge      Abi Shoults A. Ladonna Snide [facility] [phone] (office) [email or web address]

## 2020-07-14 NOTE — Progress Notes (Signed)
Recreational Therapy Discharge Summary Patient Details  Name: Megan Bailey MRN: [id number] Date of Birth: 1981/02/14 Today's Date: 07/14/2020  Long term goals set: 1  Long term goals met: 1  Comments on progress toward goals: Pt met supervision level for community reintegration w/c level.  TR sessions focused on activity analysis/activity modifications, stress management/relaxation strategies and discharge planning.  Goal met.  Reasons goals not met: n/a Equipment acquired: n/a  Reasons for discharge: treatment goals met Patient/family agrees with progress made and goals achieved: Yes  [name] 07/14/2020, 12:15 PM

## 2020-07-15 ENCOUNTER — Inpatient Hospital Stay (HOSPITAL_COMMUNITY): Payer: Medicaid Other

## 2020-07-15 ENCOUNTER — Inpatient Hospital Stay (HOSPITAL_COMMUNITY): Payer: Medicaid Other | Admitting: Physical Therapy

## 2020-07-15 NOTE — Progress Notes (Signed)
Physical Therapy Session Note  Patient Details  Name: Megan Bailey MRN: [id number] Date of Birth: 27-May-1981  Today's Date: 07/15/2020 PT Individual Time: 1500-1610 PT Individual Time Calculation (min): 70 min   Short Term Goals: Week 2:  PT Short Term Goal 1 (Week 2): STG=LTG due to LOS  Skilled Therapeutic Interventions/Progress Updates:   Pt received supine in bed and agreeable to PT. Supine>sit transfer without assist with HOB elevated. Stand pivot transfer to [facility] with supervision assist, increased time and RW. Pt reports mild R knee pain 4/10 following tranfer. WC mobility through hospital hallway and atrium 490f +2089f +25070fith distant supervision assist for safety. Pt then propelled WC up/down handicap ramp to and from [facility] and through breezway x [id number] 250ft 72f supeiisn assist with cues for unlevel surface management.   PT instructed pt in dynamic standing balance and UE endurance training while in sitting while engaged in fine motor dual task of wii sports and wii resort games [place] slice and canoe row. Supervision assist from PT while in standing with cues for NWB through the LLE as well as improved posture in standing. Pt returned to room and performed stand pivot transfer to bed with RW and supervision assist from PT to provide cues for NWB LLE. Sit>supine completed without assist, and left supine in bed with call bell in reach and all needs met.          Therapy Documentation Precautions:  Precautions Precautions: Fall Precaution Comments: anxious Required Braces or Orthoses: Other Brace Knee Immobilizer - Left: On at all times Other Brace: hinge knee brace, locked into extension at all times Restrictions Weight Bearing Restrictions: Yes RLE Weight Bearing: Weight bearing as tolerated LLE Weight Bearing: Non weight bearing    Vital Signs: Therapy Vitals Temp: 97.9 F (36.6 C) Pulse Rate: 91 Resp: 16 BP: 97/68 Patient Position  (if appropriate): Sitting Oxygen Therapy SpO2: 100 % O2 Device: Room Air  Therapy/Group: Individual Therapy  AustinLorie Phenix2021, 4:14 PM
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
## 2020-07-15 NOTE — Progress Notes (Signed)
Physical Therapy Session Note  Patient Details  Name: Megan Bailey MRN: [id number] Date of Birth: May 02, 1981  Today's Date: 07/15/2020 PT Individual Time: 3244-0102 and 1102-1200 PT Individual Time Calculation (min): 54 min and 58 min  Short Term Goals: Week 2:  PT Short Term Goal 1 (Week 2): STG=LTG due to LOS  Skilled Therapeutic Interventions/Progress Updates:     1st Session: Pt received supine in bed, brushing hair, and agreeable to therapy. Reports pain primarily in R thigh and knee. Number not reported. PT provides rest breaks as needed to manage pain symptoms. Supine to sit with supervision and use of bed features. Stand step transfer to [facility] with supervision and cues for hand placement, though pt prefers to use her own technique for sit to stand. WC transport to gym for time management. Pt performs stair training x12 steps with BHRs, ascending backward and descending forward. PT attempts to provide CGA but pt insists on performing without being touched.   Pt self propels WC 200' with BUEs, with cues for improved propulsion technique. Pt ascends and descends ramp in [facility] with PT cuing for safety. PT provides pt with WC gloves for personal use. Pt left seated in WC with alarm intact and all needs within reach.  2nd Session: Pt received seated in WC. Reports that she rang call bell to use restroom 25 minutes prior to session and has not received assistance yet. PT provides supervision and pt performs stand step transfer to elevated toilet seat. Pt performs toileting and pericare independently.   WC transport outside for time management. Pt self propels WC>500' outside for practice maneuvering over unlevel surfaces and with varying materials. PT provides verbal cues for safety and propulsion technique. Pt ambulates 100' with RW and close supervision, effectively maintaining NWB on LLE. PT suggests possible use of axillary crutches for improved efficiency but pt feels that she is more comfortable  with RW.  Pt taken to virtual apartment to practice furniture transfer. Pt able to perform stand to sit on couch with supervision, and requires minA to stand, after trying various techniques to perform without physical assistance. Pt self propels WC 200' back to room. Stand step transfer back to bed and return to supine with supervision. Left with alarm intact and all needs within reach.  Therapy Documentation Precautions:  Precautions Precautions: Fall Precaution Comments: anxious Required Braces or Orthoses: Other Brace Knee Immobilizer - Left: On at all times Other Brace: hinge knee brace, locked into extension at all times Restrictions Weight Bearing Restrictions: Yes RLE Weight Bearing: Weight bearing as tolerated LLE Weight Bearing: Non weight bearing   Therapy/Group: Individual Therapy  Beau Fanny, PT, DPT 07/15/2020, 3:49 PM

## 2020-07-16 ENCOUNTER — Inpatient Hospital Stay (HOSPITAL_COMMUNITY): Payer: Medicaid Other | Admitting: Physical Therapy

## 2020-07-16 NOTE — Progress Notes (Signed)
Physical Therapy Session Note  Patient Details  Name: Megan Bailey MRN: [id number] Date of Birth: 11/28/1980  Today's Date: 07/16/2020 PT Individual Time: 1106-1200 PT Individual Time Calculation (min): 54 min   Short Term Goals: Week 1:  PT Short Term Goal 1 (Week 1): Pt will initiate gait training with RW while maintaining NWB PT Short Term Goal 1 - Progress (Week 1): Met PT Short Term Goal 2 (Week 1): Pt will transfer to and from [facility] with min assist using RW. PT Short Term Goal 2 - Progress (Week 1): Met PT Short Term Goal 3 (Week 1): Pt will perform sit<>stand from [facility] with min assist PT Short Term Goal 3 - Progress (Week 1): Met PT Short Term Goal 4 (Week 1): Pt will propell WC through rehab unit without assist x 131f PT Short Term Goal 4 - Progress (Week 1): Met  Skilled Therapeutic Interventions/Progress Updates: Pt presents sitting up in long sit fixing hair.  Pt agreeable to therapy but very talkative and requires re-direction to task.  Pt transfers to EOB w/ supervision.  Pt transfers sit to stand w/ RW pushing up from walker.  Pt only willing to perform this way, stating it hurts her RLE.  Pt amb 5' to w/c.  Pt negotiated w/c approx. 500' to elevators , backing in w/ only occ cue.  Pt negotiates w/c on uneven surface w/ supervision.  Pt performs LE there ex for improved strengthen including LAQ, calf raises 3 x 10-15 RLE, and abd/add B LEs.  Pt negotiated w/c back to room w/ occasional rest breaks and assist from PT.  Pt amb BTB w/ RW and supervision, maintaining NWB LLE.  Pt returned to supine position w/ supervision, bed alarm on and all needs in reach.  Urinal emptied at begnning of session of 250 ml, NT notified and will document.     Therapy Documentation Precautions:  Precautions Precautions: Fall Precaution Comments: anxious Required Braces or Orthoses: Other Brace Knee Immobilizer - Left: On at all times Other Brace: hinge knee brace, locked into extension at all  times Restrictions Weight Bearing Restrictions: Yes RLE Weight Bearing: Weight bearing as tolerated LLE Weight Bearing: Non weight bearing General:   Vital Signs:  Pain:c/o soreness from yesterdays treatment.      Therapy/Group: Individual Therapy  JLadoris Gene10/12/2019, 12:50 PM
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
## 2020-07-17 ENCOUNTER — Inpatient Hospital Stay (HOSPITAL_COMMUNITY): Payer: Medicaid Other | Admitting: Occupational Therapy

## 2020-07-17 ENCOUNTER — Inpatient Hospital Stay (HOSPITAL_COMMUNITY): Payer: Medicaid Other

## 2020-07-17 LAB — BASIC METABOLIC PANEL
Anion gap: 8 (ref 5–15)
BUN: 17 mg/dL (ref 6–20)
CO2: 25 mmol/L (ref 22–32)
Calcium: 8.9 mg/dL (ref 8.9–10.3)
Chloride: 103 mmol/L (ref 98–111)
Creatinine, Ser: 0.79 mg/dL (ref 0.44–1.00)
GFR calc Af Amer: >60 mL/min (ref >60)
GFR calc non Af Amer: >60 mL/min (ref >60)
Glucose, Bld: 82 mg/dL (ref 70–99)
Potassium: 4.5 mmol/L (ref 3.5–5.1)
Sodium: 136 mmol/L (ref 135–145)

## 2020-07-17 LAB — CBC
HCT: 35.6 % — ABNORMAL LOW (ref 36.0–46.0)
Hemoglobin: 10.8 g/dL — ABNORMAL LOW (ref 12.0–15.0)
MCH: 27.8 pg (ref 26.0–34.0)
MCHC: 30.3 g/dL (ref 30.0–36.0)
MCV: 91.8 fL (ref 80.0–100.0)
Platelets: 348 10*3/uL (ref 150–400)
RBC: 3.88 MIL/uL (ref 3.87–5.11)
RDW: 13.8 % (ref 11.5–15.5)
WBC: 4.2 10*3/uL (ref 4.0–10.5)
nRBC: 0 % (ref 0.0–0.2)

## 2020-07-17 NOTE — Progress Notes (Signed)
Patient ID: Megan Bailey, female   DOB: 14-Nov-1980, 39 y.o.   MRN: [id number]  Met with pt to discuss equipment have asked for a 3 in 1 and tub bench via charity from [place] will need to await their decision. Pt has ordered a rolling walker and have a loaner wheelchair if necessary. Working toward discharge Sat 10/9. She is aware will not have nay home health due to med pay. Therapist's to give home exercise program to take home.
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
## 2020-07-17 NOTE — Progress Notes (Signed)
Occupational Therapy Session Note  Patient Details  Name: Megan Bailey MRN: [id number] Date of Birth: 05/13/1981  Today's Date: 07/17/2020 OT Individual Time: 0846-1000 and 1402-1500 OT Individual Time Calculation (min): 74 min and 58 min   Short Term Goals: Week 2:  OT Short Term Goal 1 (Week 2): STG = LTGs due to remaining LOS  Skilled Therapeutic Interventions/Progress Updates:    1) Treatment session with focus on self-care retraining, sit > stand, and functional mobility during bathing and dressing tasks.  Pt received upright in bed agreeable to shower this session.  Completed bed mobility with supervision and use of hospital bed features.  Pt completed sit > stand with RW, reporting understanding of recommendation to push up from bed but reporting that hurts her RLE therefore pt prefers to push up from RW.  Therapist educated on safety concerns with stated technique.  Pt ambulated to tub bench in room shower with RW with CGA and min assist when navigating incline in to bathroom.  Therapist covered LLE prior to shower, while educating pt on covering leg and pt then securing bag with tape to ensure remaining dry.  Pt completed bathing with lateral leans to wash buttocks.  Pt completed UB dressing with setup assist.  Ambulated to toilet with CGA to complete LB dressing.  Pt able to complete LB dressing with setup assist and CGA when standing to pull pants over hips.  Min cues for NWB through LLE during dynamic standing.  Pt ambulated back to bed with RW with CGA and min assist when navigating threshold out of bathroom. Pt returned to long sitting in bed to rest and to elevate RLE.  2)  Treatment session with focus on endurance and functional mobility.  Pt requesting to go to gift shop during session.  Pt completed bed mobility and stand pivot transfer with RW with supervision.  Pt propelled w/c on/off elevators, over uneven surfaces, and navigating obstacles within hospital and hospital gift shop.   Pt demonstrating good spatial awareness when navigating small spaces and obstacles in gift shop.  Pt showed therapist pictures of her bathroom and tub/shower.  Discussed possible options for tub/shower transfers due to wide tub ledge/soaker tub.  Plan to further problem solve tub/shower transfer.  Pt remained upright in w/c with all needs in reach.  Therapy Documentation Precautions:  Precautions Precautions: Fall Precaution Comments: anxious Required Braces or Orthoses: Other Brace Knee Immobilizer - Left: On at all times Other Brace: hinge knee brace, locked into extension at all times Restrictions Weight Bearing Restrictions: Yes RLE Weight Bearing: Weight bearing as tolerated LLE Weight Bearing: Non weight bearing General:   Vital Signs: Therapy Vitals Temp: 98 F (36.7 C) Pulse Rate: 74 Resp: 16 BP: (!) 99/58 Patient Position (if appropriate): Lying Oxygen Therapy SpO2: 95 % O2 Device: Room Air Pain: Pain Assessment Pain Scale: 0-10 Pain Score: 7  ADL: ADL ADL Comments: patient refused bathing as she did it before Vision   Perception    Praxis   Exercises:   Other Treatments:     Therapy/Group: Individual Therapy  Rosalio Loud 07/17/2020, 9:16 AM

## 2020-07-17 NOTE — Progress Notes (Signed)
Physical Therapy Session Note  Patient Details  Name: Megan Bailey MRN: [id number] Date of Birth: Feb 25, 1981  Today's Date: 07/17/2020 PT Individual Time: 1100-1157 PT Individual Time Calculation (min): 57 min   Short Term Goals: Week 1:  PT Short Term Goal 1 (Week 1): Pt will initiate gait training with RW while maintaining NWB PT Short Term Goal 1 - Progress (Week 1): Met PT Short Term Goal 2 (Week 1): Pt will transfer to and from [facility] with min assist using RW. PT Short Term Goal 2 - Progress (Week 1): Met PT Short Term Goal 3 (Week 1): Pt will perform sit<>stand from [facility] with min assist PT Short Term Goal 3 - Progress (Week 1): Met PT Short Term Goal 4 (Week 1): Pt will propell WC through rehab unit without assist x 160f PT Short Term Goal 4 - Progress (Week 1): Met Week 2:  PT Short Term Goal 1 (Week 2): STG=LTG due to LOS  Skilled Therapeutic Interventions/Progress Updates:   Received pt sitting in bed, pt agreeable to therapy, and reported pain 6/10 in R knee. RN notified and administered pain medication during session. Repositioning and rest breaks done to reduce pain levels. Session with emphasis on functional mobility/transfers, generalized strengthening, dynamic standing balance/coordination, gait training, and improved activity tolerance. CSW located 18x18 WC for pt and delivered to room but pt requested 16x16 and reported she just ordered a RW online. CSW planning to attempt to locate smaller WC. Pt requested to do hair prior to leaving for therapy. While doing hair pt expressed why she takes so long to get ready and getting tearful and emotional stating that she gets distracted and pulls her hair out and "picks" at her face due to anxiety and self esteem problems. Therapist recommended stress management techniques and pt appeared to be interested in yoga, deep breathing, and mindfulness. Discussed life changes pt wants to make and therapist discussed strategies for setting herself for  success with her desired goals. Pt continues to remain verbose and required max cues for re-direction and attention to task. Donned R shoe sitting EOB independently in figure 4 position. Pt performed bed mobility mod I and ambulated 1094fx 2 trials with RW and supervision to/from dayroom while maintaining L LE NWB precautions. Pt performed R LE strengthening on Kinetron at 10cm/sec for 3 minutes with therapist providing manual counter resistance. Concluded session with pt sitting in WC, needs within reach, and chair pad alarm on.   Therapy Documentation Precautions:  Precautions Precautions: Fall Precaution Comments: anxious Required Braces or Orthoses: Other Brace Knee Immobilizer - Left: On at all times Other Brace: hinge knee brace, locked into extension at all times Restrictions Weight Bearing Restrictions: Yes RLE Weight Bearing: Weight bearing as tolerated LLE Weight Bearing: Non weight bearing  Therapy/Group: Individual Therapy AnAlfonse AlpersT, DPT   07/17/2020, 7:26 AM

## 2020-07-18 ENCOUNTER — Encounter (HOSPITAL_COMMUNITY): Payer: Medicaid Other | Admitting: Psychology

## 2020-07-18 ENCOUNTER — Inpatient Hospital Stay (HOSPITAL_COMMUNITY): Payer: Medicaid Other

## 2020-07-18 ENCOUNTER — Inpatient Hospital Stay (HOSPITAL_COMMUNITY): Payer: Medicaid Other | Admitting: Occupational Therapy

## 2020-07-18 DIAGNOSIS — I952 Hypotension due to drugs: Secondary | ICD-10-CM

## 2020-07-18 MED ORDER — OXYCODONE HCL 5 MG PO TABS
10.0000 mg | ORAL_TABLET | Freq: Four times a day (QID) | ORAL | Status: DC | PRN
  Administered 2020-07-18 – 2020-07-19 (×3): 15 mg via ORAL
  Filled 2020-07-18 (×3): qty 3

## 2020-07-18 MED ORDER — TRAMADOL HCL 50 MG PO TABS
100.0000 mg | ORAL_TABLET | Freq: Four times a day (QID) | ORAL | Status: DC | PRN
  Administered 2020-07-19 – 2020-07-21 (×3): 100 mg via ORAL
  Filled 2020-07-18 (×4): qty 2
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
## 2020-07-18 NOTE — Progress Notes (Signed)
Physical Therapy Session Note  Patient Details  Name: Megan Bailey MRN: [id number] Date of Birth: 25-Sep-1981  Today's Date: 07/18/2020 PT Individual Time: 1100-1155 and 1300-1325  PT Individual Time Calculation (min): 55 min and 25 min  Short Term Goals: Week 1:  PT Short Term Goal 1 (Week 1): Pt will initiate gait training with RW while maintaining NWB PT Short Term Goal 1 - Progress (Week 1): Met PT Short Term Goal 2 (Week 1): Pt will transfer to and from [facility] with min assist using RW. PT Short Term Goal 2 - Progress (Week 1): Met PT Short Term Goal 3 (Week 1): Pt will perform sit<>stand from [facility] with min assist PT Short Term Goal 3 - Progress (Week 1): Met PT Short Term Goal 4 (Week 1): Pt will propell WC through rehab unit without assist x 130ft PT Short Term Goal 4 - Progress (Week 1): Met Week 2:  PT Short Term Goal 1 (Week 2): STG=LTG due to LOS  Skilled Therapeutic Interventions/Progress Updates:   Treatment Session 1: 1100-1155 55 min Received pt sitting in WC, pt agreeable to therapy, and reported minor pain in L LE increasing with weight bearing activities but did not state pain level. Repositioning and rest breaks done to reduce pain levels. Session with emphasis on functional mobility/transfers, generalized strengthening, dynamic standing balance/coordination, stair navigation, and improved activity tolerance. Pt transferred sit<>stand with RW and supervision and attempted to perform R hip abduction but pt unable due to L LE locked into extension and increased R hip/knee pain. Donned R shoe sitting in WC independently. Pt performed WC mobility 138ft x 2 trials using bilateral UEs and supervision to/from therapy gym. Pt navigated 12 steps with 2 rails and supervision ascending backwards and descending forwards while maintaining L LE NWB precautions. Pt reported her boyfriend took measurements of the distance between the rails of the steps at home and measured 42in. Attempted to  simulate in // bars but unable to increase width to 42in. Pt transported to stairwell which measured 43in and therapist demonstrated technique and pt agreeable to attempt stairs of this width this afternoon. Pt requested to sit in [facility] that CSW brought in yesterday to test it out and transferred stand<>pivot with RW and supervision. Concluded session with pt sitting in WC, needs within reach, and chair pad alarm on.   Treatment Session 2: 3734-2876 25 min Received pt sitting in WC eating lunch, pt agreeable to therapy, and reported pain 7/10 in R knee (premedicated). Repositioning and rest breaks done to reduce pain levels. Session with emphasis on functional mobility/transfers, toileting, generalized strengthening, dynamic standing balance/coordination, ambulation, stair navigation, and improved activity tolerance. Pt ambulated 21ft x 2 trials with RW and supervision to/from bathroom and able to void, manage clothing, and perform peri-care independently. Used hand sanitizer to wash hands. Pt transported to stairwell in [facility] total A for time management purposes. In stairwell pt donned WC gloves and navigated 4 steps with 2 handrails 43in apart with CGA and cues for technique as pt initially hesitant due to R knee pain. Pt ascended backwards and descending forwards while maintaining L LE NWB precautions. Pt performed WC mobility 139ft using bilateral UEs and supervision back to room. Concluded session with pt sitting in WC, needs within reach, and chair pad alarm on.   Therapy Documentation Precautions:  Precautions Precautions: Fall Precaution Comments: anxious Required Braces or Orthoses: Other Brace Knee Immobilizer - Left: On at all times Other Brace: hinge knee brace, locked into extension at  all times Restrictions Weight Bearing Restrictions: Yes RLE Weight Bearing: Partial weight bearing LLE Weight Bearing: Non weight bearing   Therapy/Group: Individual Therapy Alfonse Alpers PT,  DPT   07/18/2020, 7:35 AM

## 2020-07-18 NOTE — Progress Notes (Signed)
Occupational Therapy Session Note  Patient Details  Name: Megan Bailey MRN: [id number] Date of Birth: 1981-06-24  Today's Date: 07/18/2020 OT Individual Time: 0845-1000 and 1418-1500 OT Individual Time Calculation (min): 75 min and 42 min    Short Term Goals: Week 2:  OT Short Term Goal 1 (Week 2): STG = LTGs due to remaining LOS  Skilled Therapeutic Interventions/Progress Updates:    1) Treatment session with focus on self-care retraining, functional transfers, dynamic standing balance.  Pt received upright in bed having already changed shirt but reporting need to change pants and underwear.  Pt opted to complete LB dressing at bed level, able to thread BLE through underwear and pants without assistance.  Pt donned Lt ankle boot and readjusted knee brace to fit over boot without any assistance while maintaining knee extension.  Pt completed sit > stand from EOB with RW with close supervision.  Pt ambulated 15' to bathroom with RW with CGA when navigating threshold, otherwise supervision.  Pt completed toilet transfer to standard toilet to assess ability to complete sit <> stand from standard height.  Pt completed clothing management and toilet transfers with supervision with heavy reliance on RW for sti <> stand.  Pt ambulated to sink to complete grooming tasks at seated level.  Discussed upcoming d/c, pt reports her SO will be in and out as he works and he has plans for Saturday post d/c.  2) Treatment session with focus on functional mobility in home environment at w/c and RW level.  Pt received upright in bed agreeable to therapy session.  Completed sit > stand and stand pivot transfer with RW with supervision.  Pt propelled w/c to ADL apt Mod I for UB strengthening and endurance.  Engaged in problem solving and functional mobility in ADL kitchen with focus on w/c vs RW mobility in home.  Pt reports pain in palm of Lt hand with increased ambulation with RW and reports pain in Rt knee after PT  session, therefore discussed use of RW in larger "living" areas of her home to allow for rest of BUE as there are parts of her home that will not be w/c accessible and will require RW use.  Engaged in RW mobility in kitchen with pt able to retreive items from refrigerator with supervision, min cues for increased proximity to item to decrease fall risk.  Discussed w/c positioning when using w/c in kitchen.  Discussed rearranging frequently used items to increase ability to reach and transport items. Pt returned to room and remained upright in w/c with all needs in reach.  Therapy Documentation Precautions:  Precautions Precautions: Fall Precaution Comments: anxious Required Braces or Orthoses: Other Brace Knee Immobilizer - Left: On at all times Other Brace: hinge knee brace, locked into extension at all times Restrictions Weight Bearing Restrictions: Yes RLE Weight Bearing: Partial weight bearing LLE Weight Bearing: Non weight bearing Pain: Pain Assessment Pain Scale: 0-10 Pain Score: 8  ADL: ADL ADL Comments: patient refused bathing as she did it before   Therapy/Group: Individual Therapy  Rosalio Loud 07/18/2020, 9:27 AM

## 2020-07-18 NOTE — Progress Notes (Signed)
Patient ID: Megan Bailey, female   DOB: 03-Jun-1981, 39 y.o.   MRN: [id number]  Met with pt to discuss possible discharge earlier, she will check with boyfriend since he has to come an hour to pick her up. She did call [employer] and her equipment is coming Friday, contacted them to ask to deliver equipment Thursday in case discharged moved up.
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
## 2020-07-19 ENCOUNTER — Inpatient Hospital Stay (HOSPITAL_COMMUNITY): Payer: Medicaid Other

## 2020-07-19 ENCOUNTER — Inpatient Hospital Stay (HOSPITAL_COMMUNITY): Payer: Medicaid Other | Admitting: Occupational Therapy

## 2020-07-19 MED ORDER — GERHARDT'S BUTT CREAM
TOPICAL_CREAM | Freq: Every day | CUTANEOUS | Status: DC
  Filled 2020-07-19: qty 1

## 2020-07-19 MED ORDER — MELATONIN 3 MG PO TABS
3.0000 mg | ORAL_TABLET | Freq: Every day | ORAL | Status: DC
  Administered 2020-07-19 – 2020-07-21 (×3): 3 mg via ORAL
  Filled 2020-07-19 (×3): qty 1

## 2020-07-19 MED ORDER — ENOXAPARIN (LOVENOX) PATIENT EDUCATION KIT
PACK | Freq: Once | Status: DC
  Filled 2020-07-19: qty 1

## 2020-07-19 MED ORDER — OXYCODONE HCL 5 MG PO TABS
10.0000 mg | ORAL_TABLET | Freq: Four times a day (QID) | ORAL | Status: DC | PRN
  Administered 2020-07-19 – 2020-07-21 (×6): 10 mg via ORAL
  Filled 2020-07-19 (×6): qty 2
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
## 2020-07-19 NOTE — Progress Notes (Signed)
Occupational Therapy Session Note  Patient Details  Name: Megan Bailey MRN: [id number] Date of Birth: 20-Dec-1980  Today's Date: 07/19/2020 OT Individual Time: 7026-3785 and 1300-1345 OT Individual Time Calculation (min): 53 min and 45 min   Short Term Goals: Week 2:  OT Short Term Goal 1 (Week 2): STG = LTGs due to remaining LOS  Skilled Therapeutic Interventions/Progress Updates:    1) Treatment session with focus on self-care retraining and d/c planning.  Pt received upright in bed completing grooming tasks.  Pt reports having an awful night due to pain in Lt ankle.  Pt very distracted and even tearful during session this date.  Pt verbalizing frustrations with her SO and perceived lack of support from him in regards to d/c planning and support upon d/c.  Pt expressing desire to be as independent at possible, to not have to rely on anyone.  Discussed progress towards goals with pt stating that she feels good about her ability to complete self-care tasks and bathroom transfers. Pt did voice c/o pain in chest with increased mobility, discussed possible use of ice between therapy sessions as needed for pain.  Pt remained upright in bed as she reports too tired and still with increased pain to get OOB.  2) Treatment session with focus on functional transfers and functional mobility.  Pt received upright in bed reporting generalized pain, premedicated.  Pt having just used urinal in bed therefore pulling pants back over hips with lateral leans Mod I.  Pt expressing desire to go to gift shop.  Pt reports in better spirits this afternoon and feels that she can handle whatever comes up when she goes home.  Pt completed bed mobility Mod I and sit > stand and stand pivot transfers with RW with supervision.  Pt propelled w/c on/off elevators and around obstacles and tight spaces in hospital atrium and gift shop.  Pt able to obtain items from w/c height and asked for assistance as needed to advocate for self.   Pt returned to room Mod I level in w/c and remained upright in w/c with all needs in reach.  Therapy Documentation Precautions:  Precautions Precautions: Fall Precaution Comments: anxious Required Braces or Orthoses: Other Brace Knee Immobilizer - Left: On at all times Other Brace: hinge knee brace, locked into extension at all times Restrictions Weight Bearing Restrictions: Yes RLE Weight Bearing: Partial weight bearing LLE Weight Bearing: Non weight bearing Pain: Pain Assessment Pain Scale: 0-10 Pain Score: 8  Pain Type: Acute pain Pain Location: Generalized Pain Descriptors / Indicators: Aching Pain Frequency: Occasional Pain Onset: With Activity Pain Intervention(s): Medication (See eMAR)   Therapy/Group: Individual Therapy  Rosalio Loud 07/19/2020, 11:27 AM
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
## 2020-07-20 ENCOUNTER — Inpatient Hospital Stay (HOSPITAL_COMMUNITY): Payer: Medicaid Other

## 2020-07-20 ENCOUNTER — Other Ambulatory Visit (HOSPITAL_COMMUNITY): Payer: Self-pay | Admitting: Physical Medicine and Rehabilitation

## 2020-07-20 DIAGNOSIS — G479 Sleep disorder, unspecified: Secondary | ICD-10-CM

## 2020-07-20 MED ORDER — BUSPIRONE HCL 5 MG PO TABS: 5.0000 mg | ORAL_TABLET | Freq: Three times a day (TID) | ORAL | 0 refills | Status: DC | PRN

## 2020-07-20 MED ORDER — OXYCODONE HCL 10 MG PO TABS: 5.0000 mg | ORAL_TABLET | Freq: Four times a day (QID) | ORAL | 0 refills | Status: DC | PRN

## 2020-07-20 MED ORDER — DOCUSATE SODIUM 100 MG PO CAPS: 100.0000 mg | ORAL_CAPSULE | Freq: Two times a day (BID) | ORAL | 0 refills | Status: DC

## 2020-07-20 MED ORDER — VITAMIN D (ERGOCALCIFEROL) 1.25 MG (50000 UNIT) PO CAPS: 50000.0000 [IU] | ORAL_CAPSULE | ORAL | 0 refills | Status: DC

## 2020-07-20 MED ORDER — GABAPENTIN 300 MG PO CAPS: 600.0000 mg | ORAL_CAPSULE | Freq: Three times a day (TID) | ORAL | 0 refills | Status: DC

## 2020-07-20 MED ORDER — APIXABAN 2.5 MG PO TABS
2.5000 mg | ORAL_TABLET | Freq: Two times a day (BID) | ORAL | Status: DC
  Administered 2020-07-20 – 2020-07-22 (×4): 2.5 mg via ORAL
  Filled 2020-07-20 (×4): qty 1

## 2020-07-20 MED ORDER — TRAMADOL HCL 50 MG PO TABS: 50.0000 mg | ORAL_TABLET | Freq: Four times a day (QID) | ORAL | 0 refills | Status: DC | PRN

## 2020-07-20 MED ORDER — APIXABAN 2.5 MG PO TABS: 2.5000 mg | ORAL_TABLET | Freq: Two times a day (BID) | ORAL | 0 refills | Status: DC

## 2020-07-20 MED ORDER — LIDOCAINE 5 % EX PTCH: 1.0000 | MEDICATED_PATCH | CUTANEOUS | 0 refills | Status: DC

## 2020-07-20 MED ORDER — METHOCARBAMOL 500 MG PO TABS: 1000.0000 mg | ORAL_TABLET | Freq: Three times a day (TID) | ORAL | 0 refills | Status: DC

## 2020-07-20 MED ORDER — VITAMIN D3 25 MCG PO TABS: 2000.0000 [IU] | ORAL_TABLET | Freq: Two times a day (BID) | ORAL | 0 refills | Status: DC

## 2020-07-20 MED ORDER — MELATONIN 3 MG PO TABS: 3.0000 mg | ORAL_TABLET | Freq: Every day | ORAL | 0 refills | Status: DC

## 2020-07-20 NOTE — Progress Notes (Signed)
Pt c/o pain on the dorsum/top of right foot. Skin in tact,  No obvious signs of injury. Pt states she hurt it during therapy on 10/6 when she twisted her knee using crutches. Pt states she reported the L knee injury to therapy. Pt also reported to this nurse she would not be participating in therapy today due to the increased pain. PRN oxy given to the pt.
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
## 2020-07-20 NOTE — Progress Notes (Signed)
Occupational Therapy Session Note  Patient Details  Name: Megan Bailey MRN: [id number] Date of Birth: Aug 10, 1981  Today's Date: 07/20/2020 OT Individual Time: 1000-1045 OT Individual Time Calculation (min): 45 min  and Today's Date: 07/20/2020 OT Missed Time: 15 Minutes Missed Time Reason: Patient unwilling/refused to participate without medical reason;Pain   Short Term Goals: Week 1:  OT Short Term Goal 1 (Week 1): Pt will transfer to [facility] with MIN A and LRAD OT Short Term Goal 1 - Progress (Week 1): Met OT Short Term Goal 2 (Week 1): Pt will thread BLE into pants with AE PRN OT Short Term Goal 2 - Progress (Week 1): Met OT Short Term Goal 3 (Week 1): Pt will STS wiht LRAD and good adherence ot precautions with MIN A OT Short Term Goal 3 - Progress (Week 1): Met OT Short Term Goal 4 (Week 1): Pt will bathe with supervision and VC only (AE PRN) OT Short Term Goal 4 - Progress (Week 1): Met  Skilled Therapeutic Interventions/Progress Updates:    1;1. Pt received in bed using female urinal in bed with set up to pour out onto toilet, pt manages pants with MOD I. Pt reporting significant pain in R knee from "twisting" it yesterday practicing with crutches. Pt discussing bathroom set up of tub shower and OT discusses 2 options for set up of TTB in tub. DME delivered to room. Pt immediately overwhelmed/crying upon looking at financial assistance application. Pt provided with emotional support and OT explained bullet points of form and at end of session alerting CSW to review with pt as well. Ice pack provided to pt for pain relief and sesser terminated early d/t pt stating, "this is too much" and not wanting to get OOB. Therapy Documentation Precautions:  Precautions Precautions: Fall Precaution Comments: anxious Required Braces or Orthoses: Other Brace Knee Immobilizer - Left: On at all times Other Brace: hinge knee brace, locked into extension at all times Restrictions Weight Bearing  Restrictions: Yes RLE Weight Bearing: Partial weight bearing LLE Weight Bearing: Non weight bearing General:   Vital Signs: Therapy Vitals Pulse Rate: 82 Resp: 18 BP: (!) 99/58 Patient Position (if appropriate): Lying Oxygen Therapy SpO2: 99 % O2 Device: Room Air Pain: Pain Assessment Pain Scale: 0-10 Pain Score: 8  ADL: ADL ADL Comments: patient refused bathing as she did it before [facility]   Perception    Praxis   Exercises:   Other Treatments:     Therapy/Group: Individual Therapy  Tonny Branch 07/20/2020, 6:58 AM

## 2020-07-20 NOTE — Progress Notes (Signed)
Patient ID: Megan Bailey, female   DOB: [birth year], 39 y.o.   MRN: [id number]   Met with pt to assist with financial form for [employer]. She does receive food stamps to assist with food. She will re-apply when discharged. Equipment received from [place] and hopefully her boyfriend will come to transport her home. Match given for her medications and [facility] given for follow up with free medical clinic.
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
## 2020-07-20 NOTE — Progress Notes (Signed)
Attempted Lovenox teaching this shift. Pt was tearful and repeated "I know I cant do this and I'm not going to do it when I get home." I explained the importance of the medication and she was able to verbalize that the medication was important.  Again pt attempted but was unable to place injection into skin. Learning was not obtained. Nurse administered medication. Pt tearful, call bell in reach, side rails up. All needs met.

## 2020-07-20 NOTE — Progress Notes (Signed)
Physical Therapy Session Note  Patient Details  Name: Megan Bailey MRN: [id number] Date of Birth: Jan 21, 1981  Today's Date: 07/20/2020 PT Individual Time: 6226-3335 and 4562-5638  PT Individual Time Calculation (min): 43 min  and 71 min Today's Date: 07/20/2020 PT Missed Time: 17 Minutes Missed Time Reason: Nursing care  Short Term Goals: Week 1:  PT Short Term Goal 1 (Week 1): Pt will initiate gait training with RW while maintaining NWB PT Short Term Goal 1 - Progress (Week 1): Met PT Short Term Goal 2 (Week 1): Pt will transfer to and from [facility] with min assist using RW. PT Short Term Goal 2 - Progress (Week 1): Met PT Short Term Goal 3 (Week 1): Pt will perform sit<>stand from [facility] with min assist PT Short Term Goal 3 - Progress (Week 1): Met PT Short Term Goal 4 (Week 1): Pt will propell WC through rehab unit without assist x 130f PT Short Term Goal 4 - Progress (Week 1): Met Week 2:  PT Short Term Goal 1 (Week 2): STG=LTG due to LOS  Skilled Therapeutic Interventions/Progress Updates:   Treatment Session 1: [id number] min Received pt sitting in bed with RN present administering medication. Pt tearful and emotional and reporting pain 10/10 in R knee, R anterior ankle, and L ankle (premedicated). Pt tender to palpation along R lateral forefoot/anterior ankle and described pain as "dull ache". Pt stated "can I refuse therapy today? I'm just really hurting and need to rest". Therapist educated pt on recovery timeline, healing process, and pain science/anatomy behind sustained injuries with the expectation that pt will still have pain when she discharges on Saturday, and encouraged pt to participate in therapy. Pt stated "well I do have to go to the bathroom and I'd rather have you help me." Fastened L knee brace and cam boot and donned shorts in bed independently with increased time and multiple rest breaks with pt stating "this sucks" multiple times due to pain. MD present for morning rounds.  Pt reported needing new bandage on buttocks and pt positioned herself in bed for RN to apply bandage. Therapist notified RN and RN planning to dress wound. Pt declined any further therapy and requested to wait to get bandage on. Concluded session with pt sidelying in bed, needs within reach, and bed alarm on. 17 minutes missed of skilled physical therapy due to nursing care.   Treatment Session 2: [address] 20min  Received pt sitting in bed doing her hair and reported pain 7/10 in R knee (premedicated). Repositioning, rest breaks, and distraction due to reduce pain levels. Pt reported urge to have BM and therapist encouraged getting OOB to toilet. Pt agreed but distracted easily telling stories and extremely verbose throughout session despite redirection from therapist. Pt performed bed mobility mod I with HOB elevated and transferred bed<>WC via lateral scoot with supervision with therapist stabilizing WC. Pt transported to bathroom and transferred WC<>toilet with bedside commode over top stand<>pivot with RW and supervision with increased time due to pt anticipating increased pain upon standing. Pt reported her R knee felt unstable while standing and requested to sit on commode and doff pants/underwear via lateral leans. Pt able to manage clothing, have BM (RN aware), and perform peri-care independently. Pt transferred commode<>WC stand<>pivot with RW and supervision and washed hands sitting in WC at sink independently. Pt brushed teeth sitting in WC at sink independently. Therapist reassessed sensation, coordination, and manual muscle testing during session. Pt limited with manual muscle testing due to pain.  Pt transferred WC<>bed via lateral scoot with supervision. Concluded session with pt sitting in bed, needs within reach, and bed alarm on. Therapist provided pt with ice pack for R knee pain.   Therapy Documentation Precautions:  Precautions Precautions: Fall Precaution Comments: anxious Required  Braces or Orthoses: Other Brace Knee Immobilizer - Left: On at all times Other Brace: hinge knee brace, locked into extension at all times Restrictions Weight Bearing Restrictions: Yes RLE Weight Bearing: Partial weight bearing LLE Weight Bearing: Non weight bearing  Therapy/Group: Individual Therapy Alfonse Alpers PT, DPT   07/20/2020, 7:19 AM

## 2020-07-20 NOTE — Progress Notes (Signed)
PDMP has been reviewed for the past year. Rx written for Oxycodone 10 mg 1/2-1 tabs every 6 hours prn severe pain # 28 pills and Ultram 50 mg 1-2 tabs qid prn moderate pain # 35 pills

## 2020-07-21 ENCOUNTER — Inpatient Hospital Stay (HOSPITAL_COMMUNITY): Payer: Medicaid Other

## 2020-07-21 ENCOUNTER — Inpatient Hospital Stay (HOSPITAL_COMMUNITY): Payer: Medicaid Other | Admitting: Occupational Therapy

## 2020-07-21 MED ORDER — WHITE PETROLATUM EX OINT
TOPICAL_OINTMENT | CUTANEOUS | Status: AC
  Filled 2020-07-21: qty 28.35

## 2020-07-21 MED FILL — METHOCARBAMOL 500 MG TABS: 500 | 30 days supply | Qty: 180 | Fill #0

## 2020-07-21 MED FILL — ELIQUIS 2.5 MG TABLET: 2.5 | 30 days supply | Qty: 60 | Fill #0

## 2020-07-21 MED FILL — VITAMIN D3 25 MCG TABS: 25 | 30 days supply | Qty: 120 | Fill #0

## 2020-07-21 MED FILL — LIDOCAINE PATCH 5%: 5 | 30 days supply | Qty: 30 | Fill #0

## 2020-07-21 MED FILL — GABAPENTIN 300 MG CAPSULE: 300 | 30 days supply | Qty: 180 | Fill #0

## 2020-07-21 MED FILL — busPIRone HCL 5 MG TABS: 5 | 10 days supply | Qty: 30 | Fill #0

## 2020-07-21 MED FILL — DOCUSATE SODIUM 100 MG CAPS: 100 | 30 days supply | Qty: 60 | Fill #0

## 2020-07-21 MED FILL — MELATONIN 3 MG TABS: 3 | 30 days supply | Qty: 30 | Fill #0

## 2020-07-21 MED FILL — traMADol HCL 50 MG TABS: 50 | 4 days supply | Qty: 35 | Fill #0

## 2020-07-21 MED FILL — oxyCODONE HCL 10 MG TABS: 10 | 7 days supply | Qty: 28 | Fill #0

## 2020-07-21 MED FILL — VIT D2 1.25 MG (50,000 UNIT: 1.25 MG | 5 days supply | Qty: 5 | Fill #0

## 2020-07-21 NOTE — Progress Notes (Signed)
Occupational Therapy Session Note  Patient Details  Name: Megan Bailey MRN: [id number] Date of Birth: 10-02-1981  Today's Date: 07/21/2020 OT Individual Time: 5284-1324 OT Individual Time Calculation (min): 49 min    Short Term Goals: Week 2:  OT Short Term Goal 1 (Week 2): STG = LTGs due to remaining LOS  Skilled Therapeutic Interventions/Progress Updates:    Treatment session with focus on self-care retraining, functional transfers, and sit <> stand.  Pt received upright in bed reporting desire to shower this session.  Pt completed bed mobility, sit > stand, and stand pivot transfer with RW Mod I.  Due to reports of pain in Rt knee and discomfort in hands with increased ambulation, pt requested to utilize w/c into bathroom.  Sit > stand and stand pivot in to room shower with RW with setup of items.  Pt covered LLE with plastic bag with min directional cues to ensure limb remaining dry.  Pt completed bathing with lateral leans on tub bench.  Pt completed drying with lateral leans.  Pt completed UB dressing from tub bench.  Completed sit >stand from tub bench with RW and ambulated short distance to w/c.  Pt completed lateral scoot back to bed at Mod I level.  Due to time constraints and pain, pt completed LB dressing Mod I at bed level.    Therapy Documentation Precautions:  Precautions Precautions: Fall Precaution Comments: anxious Required Braces or Orthoses: Other Brace Knee Immobilizer - Left: On at all times Other Brace: hinge knee brace, locked into extension Restrictions Weight Bearing Restrictions: Yes RLE Weight Bearing: Weight bearing as tolerated LLE Weight Bearing: Non weight bearing General:   Vital Signs: Therapy Vitals Temp: 97.7 F (36.5 C) Pulse Rate: 69 Resp: 18 BP: (!) 87/46 Patient Position (if appropriate): Lying Oxygen Therapy SpO2: 99 % O2 Device: Room Air Pain: Pain Assessment Pain Scale: 0-10 Pain Score: 6  Pain Type: Acute pain Pain Location:  Leg Pain Orientation: Right;Left Pain Descriptors / Indicators: Aching Pain Frequency: Constant Pain Onset: With Activity Pain Intervention(s): Medication (See eMAR)   Therapy/Group: Individual Therapy  Rosalio Loud 07/21/2020, 9:38 AM

## 2020-07-21 NOTE — Progress Notes (Signed)
Occupational Therapy Discharge Summary  Patient Details  Name: Aleaha Fickling MRN: [id number] Date of Birth: 1981-01-07  Patient has met 52 of 10 long term goals due to improved activity tolerance, improved balance, ability to compensate for deficits and improved awareness.  Patient to discharge at overall Modified Independent level.  Patient's care partner unavailable to provide the necessary physical assistance at discharge.  Patient's significant other has not been present for any education due to working an hour away and unable to attend sessions.  Patient is to d/c at Mod I overall, with exception of setup for bathing at shower level which pt understands recommendation and reports that she will not shower unless someone is in the home with her.  Reasons goals not met: N/A  Recommendation:  Patient will not require follow up OT at this time.  Equipment: 3 in1 and tub transfer bench  Reasons for discharge: treatment goals met and discharge from hospital  Patient/family agrees with progress made and goals achieved: Yes  OT Discharge Precautions/Restrictions  Precautions Precautions: Fall Precaution Comments: anxious Required Braces or Orthoses: Other Brace Knee Immobilizer - Left: On at all times Other Brace: Lt hinge knee brace locked to allow up to 30* flexion, Lt ankle boot Restrictions Weight Bearing Restrictions: Yes RLE Weight Bearing: Weight bearing as tolerated LLE Weight Bearing: Non weight bearing General   Vital Signs Therapy Vitals Temp: 97.7 F (36.5 C) Pulse Rate: 69 Resp: 18 BP: (!) 87/46 Patient Position (if appropriate): Lying Oxygen Therapy SpO2: 99 % O2 Device: Room Air Pain Pain Assessment Pain Scale: 0-10 Pain Score: 6  Pain Type: Acute pain Pain Location: Leg Pain Orientation: Right;Left Pain Descriptors / Indicators: Aching Pain Frequency: Constant Pain Onset: With Activity Pain Intervention(s): Medication (See eMAR) ADL ADL Eating:  Independent Grooming: Independent Upper Body Bathing: Setup Where Assessed-Upper Body Bathing: Shower Lower Body Bathing: Setup Where Assessed-Lower Body Bathing: Shower Upper Body Dressing: Independent Where Assessed-Upper Body Dressing: Edge of bed Lower Body Dressing: Independent Where Assessed-Lower Body Dressing: Bed level Toileting: Modified independent Where Assessed-Toileting: Bedside Commode (over toilet) Toilet Transfer: Modified independent Toilet Transfer Method: Counselling psychologist: Geophysical data processor:  (Setup) Social research officer, government Method: Stand pivot, Heritage manager: Radio broadcast assistant ADL Comments: patient refused bathing as she did it before Vision Baseline Vision/History: No visual deficits Patient Visual Report: No change from baseline Vision Assessment?: No apparent visual deficits Perception  Perception: Within Functional Limits Praxis Praxis: Intact Cognition Overall Cognitive Status: Within Functional Limits for tasks assessed Arousal/Alertness: Awake/alert Orientation Level: Oriented X4 Attention: Selective Selective Attention: Appears intact Memory: Appears intact Awareness: Appears intact Problem Solving: Appears intact Safety/Judgment: Appears intact Comments: anxious Sensation Sensation Light Touch: Appears Intact Proprioception: Appears Intact Coordination Gross Motor Movements are Fluid and Coordinated: No Fine Motor Movements are Fluid and Coordinated: Yes Coordination and Movement Description: grossly uncoordinated due to L LE NWB status, L knee brace locked into extension, generalized weakness, and pain Finger Nose Finger Test: [facility] Heel Shin Test: WFL on R LE, unable to perform on L LE due to knee brace locked into extension Motor  Motor Motor: Abnormal postural alignment and control Motor - Skilled Clinical Observations: L LE NWB status, L knee brace locked into extension,  generalized weakness, and pain Mobility  Bed Mobility Bed Mobility: Rolling Right;Rolling Left;Supine to Sit;Sit to Supine Rolling Right: Independent Rolling Left: Independent Supine to Sit: Independent Sit to Supine: Independent Transfers Sit to Stand: Independent with assistive device Stand  to Sit: Independent with assistive device  Trunk/Postural Assessment  Cervical Assessment Cervical Assessment: Within Functional Limits Thoracic Assessment Thoracic Assessment: Within Functional Limits Lumbar Assessment Lumbar Assessment: Within Functional Limits Postural Control Postural Control: Within Functional Limits  Balance Balance Balance Assessed: Yes Static Sitting Balance Static Sitting - Balance Support: Feet supported;No upper extremity supported Static Sitting - Level of Assistance: 7: Independent Dynamic Sitting Balance Dynamic Sitting - Balance Support: No upper extremity supported;Feet supported Dynamic Sitting - Level of Assistance: 7: Independent Static Standing Balance Static Standing - Balance Support: Bilateral upper extremity supported (RW) Static Standing - Level of Assistance: 6: Modified independent (Device/Increase time) Dynamic Standing Balance Dynamic Standing - Balance Support: Bilateral upper extremity supported (RW) Dynamic Standing - Level of Assistance: 6: Modified independent (Device/Increase time) Extremity/Trunk Assessment RUE Assessment RUE Assessment: Within Functional Limits LUE Assessment LUE Assessment: Within Functional Limits   Kikuye Korenek, [facility] 07/21/2020, 9:42 AM

## 2020-07-21 NOTE — Progress Notes (Signed)
Physical Therapy Session Note  Patient Details  Name: Megan Bailey MRN: [id number] Date of Birth: Jul 19, 1981  Today's Date: 07/21/2020 PT Individual Time: 1303-1330 PT Individual Time Calculation (min): 27 min   Short Term Goals: Week 2:  PT Short Term Goal 1 (Week 2): STG=LTG due to LOS  Skilled Therapeutic Interventions/Progress Updates:    Patient received sitting up in bed agreeable to PT. She reports "slight pain" in R knee, but did not report numerical rating. PT providing rest breaks throughout for pain management as patient denied premedication. Patient able to transfer to wc via lateral scoot with set up assist and propel herself in wc to therapy gym independently. Patient completing standing dynamic task x5 mins at a time with FWW and distant supervision. Patient reporting that ortho surgeon unlocked her L knee Bledsoe brace to allow for 30* flexion. Patient stating that she wants to lock knee brace in full extension. This PT advised patient to follow surgeons orders/requests for optimal healing. Patient maintains that she wants to lock brace out in extension again. Patient returning to bed via lateral scoot and set up assist. Bed alarm on, call light within reach.   Therapy Documentation Precautions:  Precautions Precautions: Fall Precaution Comments: anxious Required Braces or Orthoses: Other Brace Knee Immobilizer - Left: On at all times Other Brace: hinge knee brace, locked into extension Restrictions Weight Bearing Restrictions: Yes RLE Weight Bearing: Weight bearing as tolerated LLE Weight Bearing: Non weight bearing   Therapy/Group: Individual Therapy  Elizebeth Koller, PT, DPT, CBIS 07/21/2020, 7:44 AM

## 2020-07-21 NOTE — Progress Notes (Signed)
Late entry: Pt called medical records from room and requested copy of her records for this visit. CD's received via tube station and I gave to pt.
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
## 2020-07-21 NOTE — Progress Notes (Signed)
Physical Therapy Discharge Summary  Patient Details  Name: Megan Bailey MRN: [id number] Date of Birth: Aug 22, 1981  Today's Date: 07/21/2020 PT Individual Time: 1301-1410 PT Individual Time Calculation (min): 69 min   Patient has met 11 of 11 long term goals due to improved activity tolerance, improved balance, improved postural control, increased strength, improved awareness and improved coordination. Patient to discharge at a wheelchair level Modified Independent. Pt's boyfriend did not attend any family education training. However, pt is currently at a mod I level and verbalized/demonstrated confidence with transfers/mobility to ensure safe discharge home.   All goals met  Recommendation:  Patient will not require any ongoing skilled PT services at discharge due to current mobility level.   Equipment: 16x16 manual WC with L elevating legrest   Reasons for discharge: treatment goals met  Patient/family agrees with progress made and goals achieved: Yes  Today's Interventions: Received pt sitting in bed, pt agreeable to therapy, and reported her R knee feels better than yesterday but "it still hurts". Pt did not state pain level (premedicated). Pt requested to apply face lotion prior to starting session. Session with emphasis on functional mobility/transfers, generalized strengthening, dynamic standing balance/coordination, and improved activity tolerance. RN present to administer medications. Pt performed bed mobility independently x 2 trials throughout session and transferred bed<>WC stand<>pivot with RW mod I while maintaining L LE NWB precautions. Pt performed WC mobility 178f using bilateral UEs mod I to dayroom. Worked on dynamic standing balance playing connect 4 x 3 trials with RW mod I. Pt able to pick up connect four pieces from floor using RW and supervision. Pt declined ambulating or stair navigation today stating "I don't want to hurt my knee worse". Pt transferred WC<>Nustep  stand<>pivot mod I with RW and performed bilateral UE and R LE strengthening on Nustep at workload 5 for 10 minutes for a total of 301 steps for improved cardiovascular endruance. Pt continues to remain verbose and requires cues for redirection and focused attention. Stand<>pivot Nustep<>WC mod I with RW. [facility] and transferred WC<>bed stand<>pivot with RW mod I. Concluded session with pt sitting in bed, needs within reach, and bed alarm on.   PT Discharge Precautions/Restrictions Precautions Precautions: Fall Precaution Comments: anxious Required Braces or Orthoses: Other Brace Knee Immobilizer - Left: On at all times Other Brace: hinge knee brace, locked into extension Restrictions Weight Bearing Restrictions: Yes RLE Weight Bearing: Weight bearing as tolerated LLE Weight Bearing: Non weight bearing Cognition Overall Cognitive Status: Within Functional Limits for tasks assessed Arousal/Alertness: Awake/alert Orientation Level: Oriented X4 Memory: Appears intact Awareness: Appears intact Problem Solving: Appears intact Safety/Judgment: Appears intact Sensation Sensation Light Touch: Appears Intact Proprioception: Appears Intact Coordination Gross Motor Movements are Fluid and Coordinated: No Fine Motor Movements are Fluid and Coordinated: Yes Coordination and Movement Description: grossly uncoordinated due to L LE NWB status, L knee brace locked into extension, generalized weakness, and pain Finger Nose Finger Test: [facility] Shin Test: WFL on R LE, unable to perform on L LE due to knee brace locked into extension Motor  Motor Motor: Abnormal postural alignment and control Motor - Skilled Clinical Observations: L LE NWB status, L knee brace locked into extension, generalized weakness, and pain  Mobility Bed Mobility Bed Mobility: Rolling Right;Rolling Left;Supine to Sit;Sit to Supine Rolling Right: Independent Rolling Left:  Independent Supine to Sit: Independent Sit to Supine: Independent Transfers Transfers: Lateral/Scoot Transfers;Sit to Stand;Stand to Sit;Stand Pivot Transfers Sit to Stand: Independent  with assistive device Stand to Sit: Independent with assistive device Stand Pivot Transfers: Independent with assistive device Lateral/Scoot Transfers: Independent with assistive device;Dependent - Patient 0%;Moderate Assistance - Patient 50-74% Transfer (Assistive device): Rolling walker Locomotion  Gait Ambulation: Yes Gait Assistance: Independent with assistive device Gait Distance (Feet): 100 Feet Assistive device: Rolling walker Gait Gait: Yes Gait Pattern: Impaired Gait Pattern: Step-to pattern;Decreased stride length;Poor foot clearance - right;Decreased trunk rotation;Antalgic Gait velocity: decreased Stairs / Additional Locomotion Stairs: Yes Stairs Assistance: Supervision/Verbal cueing Stair Management Technique: Two rails Number of Stairs: 12 Height of Stairs: 6 Ramp: Supervision/Verbal cueing (RW) Product manager Mobility: Yes Wheelchair Assistance: Independent with Camera operator: Both upper extremities;Left upper extremity Wheelchair Parts Management: Supervision/cueing Distance: >162f  Trunk/Postural Assessment  Cervical Assessment Cervical Assessment: Within Functional Limits Thoracic Assessment Thoracic Assessment: Within Functional Limits Lumbar Assessment Lumbar Assessment: Within Functional Limits Postural Control Postural Control: Within Functional Limits  Balance Balance Balance Assessed: Yes Static Sitting Balance Static Sitting - Balance Support: Feet supported;No upper extremity supported Static Sitting - Level of Assistance: 7: Independent Dynamic Sitting Balance Dynamic Sitting - Balance Support: No upper extremity supported;Feet supported Dynamic Sitting - Level of Assistance: 7: Independent Static Standing  Balance Static Standing - Balance Support: Bilateral upper extremity supported (RW) Static Standing - Level of Assistance: 6: Modified independent (Device/Increase time) Dynamic Standing Balance Dynamic Standing - Balance Support: Bilateral upper extremity supported (RW) Dynamic Standing - Level of Assistance: 6: Modified independent (Device/Increase time) Extremity Assessment  RLE Assessment RLE Assessment: Exceptions to WM[facility] CenterGeneral Strength Comments: grossly generalized to 4/5 (except knee flexion/extension 4-/5) LLE Assessment LLE Assessment: Exceptions to [facility] LLCGeneral Strength Comments: grossly generalized to 4/5 (hip flexion, abduction/adduction) limited due to  knee extension brace and boot  AAlfonse AlpersPT, DPT  07/21/2020, 7:45 AM

## 2020-07-21 NOTE — Progress Notes (Signed)
Physical Therapy Session Note  Patient Details  Name: Megan Bailey MRN: [id number] Date of Birth: 08-17-1981  Today's Date: 07/21/2020 PT Individual Time: 1130-1200 PT Individual Time Calculation (min): 30 min   Short Term Goals: Week 1:  PT Short Term Goal 1 (Week 1): Pt will initiate gait training with RW while maintaining NWB PT Short Term Goal 1 - Progress (Week 1): Met PT Short Term Goal 2 (Week 1): Pt will transfer to and from [facility] with min assist using RW. PT Short Term Goal 2 - Progress (Week 1): Met PT Short Term Goal 3 (Week 1): Pt will perform sit<>stand from [facility] with min assist PT Short Term Goal 3 - Progress (Week 1): Met PT Short Term Goal 4 (Week 1): Pt will propell WC through rehab unit without assist x 168f PT Short Term Goal 4 - Progress (Week 1): Met Week 2:  PT Short Term Goal 1 (Week 2): STG=LTG due to LOS Week 3:     Skilled Therapeutic Interventions/Progress Updates:    PAIN pt c/o R knee and bilat ankle pain  Pt initially seated in bed and insists on spraying conditioner into hair before participating in session.  Pt complaining about being asked to participate/states she will be tired tomorrow when she is going home.  Long to short sit on edge of bed mod I STS from elevated bed mod I to RW, SPT to wc w/RW mod I w/additional time.   wc propulsion x >2048fmod I  wc propulsion ascended/descended ramp x 3 w/supervision  Obstacle course - propels thru cones w/progressively decreased turning radius repeatd 3083f 4 mod I  Picking up cones from floor from wc level performed w/supervision.  wc propulsion back to room mod i  wc to bed lateral scoot w/therapist stabilizing wc.   Pt participates begrudgingly and somewhat hyperverbal w/inappropriate casual use of profanity during session.  Therapy Documentation Precautions:  Precautions Precautions: Fall Precaution Comments: anxious Required Braces or Orthoses: Other Brace Knee Immobilizer - Left: On at all  times Other Brace: Lt hinge knee brace locked to allow up to 30* flexion, Lt ankle boot Restrictions Weight Bearing Restrictions: Yes RLE Weight Bearing: Weight bearing as tolerated LLE Weight Bearing: Non weight bearing    Therapy/Group: Individual Therapy  BarCallie FieldingT [place]/05/2020, 12:20 PM

## 2020-07-25 ENCOUNTER — Telehealth: Payer: Self-pay
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
## 2020-07-25 NOTE — Telephone Encounter (Signed)
If she really thinks that she broke her leg again, she needs to go to Ortho or urgent care to have it evaluated. Thanks.

## 2020-07-27 NOTE — Telephone Encounter (Signed)
Megan Bailey called back today and she says her knee cap is moving (she is able to manipulate it) and does not feel the need to make a separate trip to be seen.  She reports has an appt with Ortho on 10/25. Her appt with Megan Bailey is on Monday 07/31/20 and will be by video chat.

## 2020-07-31 ENCOUNTER — Encounter: Payer: Self-pay | Attending: Physical Medicine & Rehabilitation | Admitting: Physical Medicine & Rehabilitation

## 2020-07-31 ENCOUNTER — Encounter: Payer: Self-pay | Admitting: Physical Medicine & Rehabilitation

## 2020-07-31 ENCOUNTER — Other Ambulatory Visit: Payer: Self-pay

## 2020-07-31 VITALS — Ht 63.0 in | Wt 165.0 lb

## 2020-07-31 DIAGNOSIS — T1490XA Injury, unspecified, initial encounter: Secondary | ICD-10-CM

## 2020-07-31 DIAGNOSIS — F411 Generalized anxiety disorder: Secondary | ICD-10-CM

## 2020-07-31 DIAGNOSIS — G8918 Other acute postprocedural pain: Secondary | ICD-10-CM

## 2020-07-31 DIAGNOSIS — R269 Unspecified abnormalities of gait and mobility: Secondary | ICD-10-CM

## 2020-07-31 DIAGNOSIS — I952 Hypotension due to drugs: Secondary | ICD-10-CM
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
## 2020-10-26 IMAGING — DX DG KNEE 1-2V*R*
2 series · 2 of 2 positions shown · non-contrast
Comparison: 06/22/2020

CLINICAL DATA: Right femoral ORIF

EXAM:
RIGHT KNEE - 1-2 VIEW; RIGHT FEMUR 2 VIEWS

[knee ap]
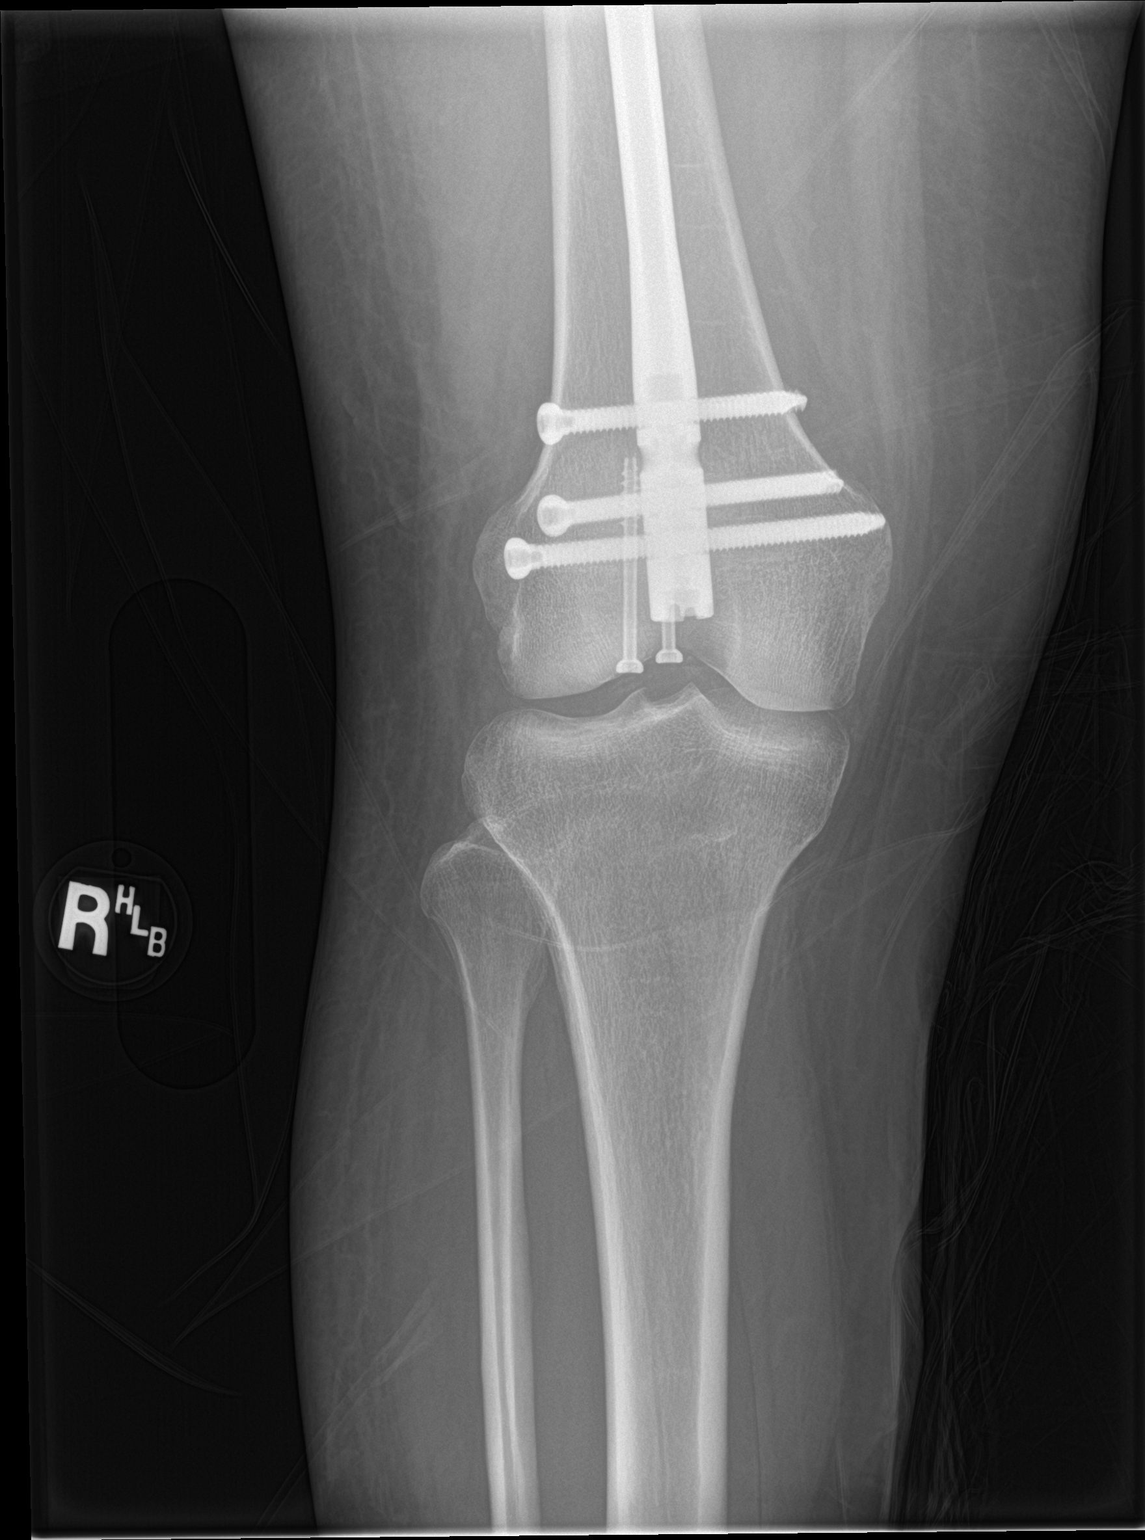

[knee lat]
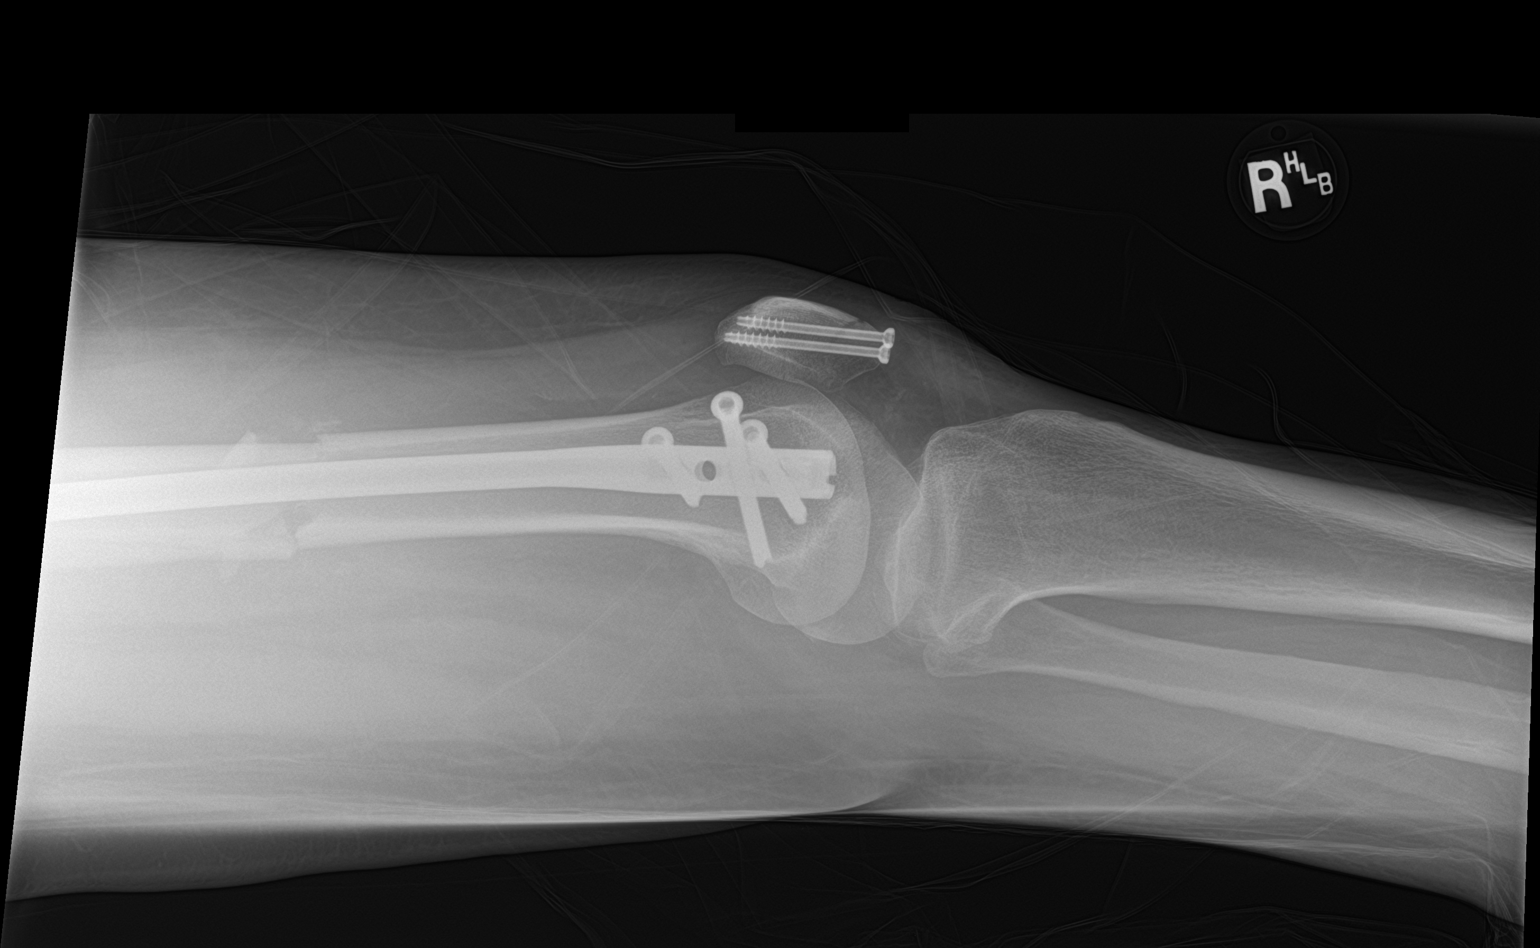

[2 of 2 positions shown; findings below may reference images not displayed]

FINDINGS: Redemonstration of postsurgical changes from ORIF of the distal
right femoral diaphyseal fracture via retrograde intramedullary rod
with proximal and distal interlocking screws. Fracture alignment
remains near anatomic. Subtle early periosteal new bone formation
along the fracture margins without bridging bone formation. A 2.1 x
1.0 cm fracture fragment remains along the lateral fracture margin,
unchanged in positioning. No new perihardware fracture.

Status post patellar ORIF via 2 partially threaded cannulated screws
in good anatomic alignment. Alignment at the hip and knee are
maintained. No large knee joint effusion. Soft tissues unremarkable.
IMPRESSION: 1. Postsurgical changes from ORIF of the distal right femoral
diaphyseal fracture, in near anatomic alignment with subtle changes
of early fracture healing.
2. Unchanged 2.1 x 1.0 cm fracture fragment along the lateral
fracture margin.
